# Patient Record
Sex: Female | Born: 1985 | Race: Black or African American | Hispanic: No | Marital: Married | Smoking: Never smoker
Health system: Southern US, Community
[De-identification: ages and names within clinical notes are randomized; demographics above are authoritative.]

## PROBLEM LIST (undated history)

## (undated) DIAGNOSIS — O139 Gestational [pregnancy-induced] hypertension without significant proteinuria, unspecified trimester: Secondary | ICD-10-CM

## (undated) DIAGNOSIS — F419 Anxiety disorder, unspecified: Secondary | ICD-10-CM

## (undated) HISTORY — PX: LASIK: SHX215

## (undated) HISTORY — PX: WISDOM TOOTH EXTRACTION: SHX21

---

## 2016-06-22 HISTORY — PX: CERVICAL POLYPECTOMY: SHX88

## 2019-10-01 ENCOUNTER — Emergency Department (HOSPITAL_COMMUNITY): Payer: No Typology Code available for payment source

## 2019-10-01 ENCOUNTER — Other Ambulatory Visit: Payer: Self-pay

## 2019-10-01 ENCOUNTER — Encounter (HOSPITAL_COMMUNITY): Payer: Self-pay | Admitting: Emergency Medicine

## 2019-10-01 ENCOUNTER — Emergency Department (HOSPITAL_COMMUNITY)
Admission: EM | Admit: 2019-10-01 | Discharge: 2019-10-02 | Disposition: A | Discharge status: Home / Self Care | Payer: No Typology Code available for payment source | Attending: Emergency Medicine | Admitting: Emergency Medicine

## 2019-10-01 DIAGNOSIS — Z3A01 Less than 8 weeks gestation of pregnancy: Secondary | ICD-10-CM | POA: Insufficient documentation

## 2019-10-01 DIAGNOSIS — O3680X0 Pregnancy with inconclusive fetal viability, not applicable or unspecified: Secondary | ICD-10-CM

## 2019-10-01 DIAGNOSIS — N760 Acute vaginitis: Secondary | ICD-10-CM | POA: Insufficient documentation

## 2019-10-01 DIAGNOSIS — N939 Abnormal uterine and vaginal bleeding, unspecified: Secondary | ICD-10-CM

## 2019-10-01 DIAGNOSIS — R0602 Shortness of breath: Secondary | ICD-10-CM | POA: Insufficient documentation

## 2019-10-01 DIAGNOSIS — R102 Pelvic and perineal pain: Secondary | ICD-10-CM | POA: Insufficient documentation

## 2019-10-01 DIAGNOSIS — O209 Hemorrhage in early pregnancy, unspecified: Secondary | ICD-10-CM | POA: Diagnosis present

## 2019-10-01 DIAGNOSIS — O200 Threatened abortion: Secondary | ICD-10-CM | POA: Diagnosis not present

## 2019-10-01 DIAGNOSIS — B9689 Other specified bacterial agents as the cause of diseases classified elsewhere: Secondary | ICD-10-CM

## 2019-10-01 LAB — BASIC METABOLIC PANEL
Anion gap: 11 (ref 5–15)
BUN: 7 mg/dL (ref 6–20)
CO2: 22 mmol/L (ref 22–32)
Calcium: 9.2 mg/dL (ref 8.9–10.3)
Chloride: 103 mmol/L (ref 98–111)
Creatinine, Ser: 0.88 mg/dL (ref 0.44–1.00)
GFR calc Af Amer: >60 mL/min (ref >60)
GFR calc non Af Amer: >60 mL/min (ref >60)
Glucose, Bld: 150 mg/dL — ABNORMAL HIGH (ref 70–99)
Potassium: 3 mmol/L — ABNORMAL LOW (ref 3.5–5.1)
Sodium: 136 mmol/L (ref 135–145)

## 2019-10-01 LAB — CBC
HCT: 38.5 % (ref 36.0–46.0)
Hemoglobin: 12.6 g/dL (ref 12.0–15.0)
MCH: 25 pg — ABNORMAL LOW (ref 26.0–34.0)
MCHC: 32.7 g/dL (ref 30.0–36.0)
MCV: 76.4 fL — ABNORMAL LOW (ref 80.0–100.0)
Platelets: 260 10*3/uL (ref 150–400)
RBC: 5.04 MIL/uL (ref 3.87–5.11)
RDW: 12.3 % (ref 11.5–15.5)
WBC: 8.2 10*3/uL (ref 4.0–10.5)
nRBC: 0 % (ref 0.0–0.2)

## 2019-10-01 LAB — I-STAT BETA HCG BLOOD, ED (MC, WL, AP ONLY): I-stat hCG, quantitative: 1899.7 m[IU]/mL — ABNORMAL HIGH (ref <5)

## 2019-10-01 NOTE — ED Triage Notes (Signed)
Pt reports vaginal bleeding that started yesterday with spotting. Pt reports this afternoon she was passing golf ball sized clots. Pt reports left pelvic pain. Pt reports she has been sob since last week and thought it was her allergies but woke up this morning feeling worse. Pt reports 7lb weight gain this past week. LMP ended 09/24/19.

## 2019-10-02 ENCOUNTER — Emergency Department (HOSPITAL_COMMUNITY): Payer: No Typology Code available for payment source

## 2019-10-02 ENCOUNTER — Encounter (HOSPITAL_COMMUNITY): Payer: Self-pay | Admitting: Emergency Medicine

## 2019-10-02 LAB — URINALYSIS, ROUTINE W REFLEX MICROSCOPIC
Bacteria, UA: NONE SEEN
Bilirubin Urine: NEGATIVE
Glucose, UA: NEGATIVE mg/dL
Ketones, ur: NEGATIVE mg/dL
Leukocytes,Ua: NEGATIVE
Nitrite: NEGATIVE
Protein, ur: NEGATIVE mg/dL
Specific Gravity, Urine: 1.017 (ref 1.005–1.030)
pH: 6 (ref 5.0–8.0)

## 2019-10-02 LAB — GC/CHLAMYDIA PROBE AMP (~~LOC~~) NOT AT ARMC
Chlamydia: NEGATIVE
Comment: NEGATIVE
Comment: NORMAL
Neisseria Gonorrhea: NEGATIVE

## 2019-10-02 LAB — WET PREP, GENITAL
Sperm: NONE SEEN
Trich, Wet Prep: NONE SEEN
Yeast Wet Prep HPF POC: NONE SEEN

## 2019-10-02 LAB — TROPONIN I (HIGH SENSITIVITY): Troponin I (High Sensitivity): 4 ng/L (ref <18)

## 2019-10-02 MED ORDER — METRONIDAZOLE 500 MG PO TABS
500.0000 mg | ORAL_TABLET | ORAL | Status: AC
  Administered 2019-10-02: 500 mg via ORAL
  Filled 2019-10-02: qty 1

## 2019-10-02 MED ORDER — ALBUTEROL SULFATE HFA 108 (90 BASE) MCG/ACT IN AERS
2.0000 | INHALATION_SPRAY | Freq: Once | RESPIRATORY_TRACT | Status: AC
  Administered 2019-10-02: 2 via RESPIRATORY_TRACT
  Filled 2019-10-02: qty 6.7

## 2019-10-02 MED ORDER — METRONIDAZOLE 500 MG PO TABS: 500.0000 mg | ORAL_TABLET | Freq: Two times a day (BID) | ORAL | 0 refills | Status: DC

## 2019-10-02 NOTE — ED Notes (Signed)
Wet prep recollected and sent at this time

## 2019-10-02 NOTE — ED Provider Notes (Addendum)
[facility] EMERGENCY DEPARTMENT Provider Note   CSN: [id number] Arrival date & time: 10/01/19  1931     History Chief Complaint  Patient presents with  . Shortness of Breath  . Vaginal Bleeding    Mary Duran is a 34 y.o. female.  The history is provided by the patient.  Shortness of Breath Severity:  Moderate Onset quality:  Gradual Timing:  Constant Progression:  Unchanged Chronicity:  New Context: known allergens and pollens   Relieved by:  Nothing Worsened by:  Nothing Ineffective treatments: claritin and singulair. Associated symptoms: no chest pain, no claudication, no cough, no diaphoresis, no ear pain, no fever, no neck pain, no PND, no sore throat, no sputum production, no syncope, no swollen glands and no vomiting   Associated symptoms comment:  No leg pain no swelling Risk factors: no hx of PE/DVT   Vaginal Bleeding Quality:  Clots Severity:  Moderate Onset quality:  Gradual Duration:  1 day Timing:  Constant Progression:  Unchanged Chronicity:  New Menstrual history:  Irregular Possible pregnancy: yes   Context: not after intercourse   Relieved by:  Nothing Worsened by:  Nothing Ineffective treatments:  None tried Associated symptoms: no dizziness and no fever   Associated symptoms comment:  Pelvic pain on the left  Patient who is A positive presents with left pelvic pain and had a normal period for 4 days that ended on 4/4 and now has left pelvic pain and bleeding with passage of clots.  No CP.  No cough. No hemoptysis. No f/c/r.  No trauma.  No leg pain nor swelling.  Has seasonal allergies but medications do not seem to be controlling her symptoms.      History reviewed. No pertinent past medical history.  There are no problems to display for this patient.   Past Surgical History:  Procedure Laterality Date  . CERVICAL POLYPECTOMY  2018  . LASIK    . WISDOM TOOTH EXTRACTION       OB History   No obstetric history  on file.     History reviewed. No pertinent family history.  Social History   Tobacco Use  . Smoking status: Never Smoker  . Smokeless tobacco: Never Used  Substance Use Topics  . Alcohol use: Never  . Drug use: Never    Home Medications Prior to Admission medications   Not on File    Allergies    Sulfa antibiotics  Review of Systems   Review of Systems  Constitutional: Negative for diaphoresis and fever.  HENT: Negative for ear pain and sore throat.   Eyes: Negative for visual disturbance.  Respiratory: Positive for shortness of breath. Negative for cough and sputum production.   Cardiovascular: Negative for chest pain, claudication, syncope and PND.  Gastrointestinal: Negative for vomiting.  Genitourinary: Positive for pelvic pain and vaginal bleeding.  Musculoskeletal: Negative for neck pain.  Neurological: Negative for dizziness.  Psychiatric/Behavioral: Negative for agitation.    Physical Exam Updated Vital Signs BP 118/81   Pulse 69   Temp 98.3 F (36.8 C) (Oral)   Resp 17   Ht 5\' 3"  (1.6 m)   Wt 80.3 kg   LMP 09/24/2019   SpO2 100%   BMI 31.35 kg/m   Physical Exam  ED Results / Procedures / Treatments   Labs (all labs ordered are listed, but only abnormal results are displayed) Results for orders placed or performed during the hospital encounter of 10/01/19  Wet prep, genital  Specimen: Vaginal  Result Value Ref Range   Yeast Wet Prep HPF POC NONE SEEN NONE SEEN   Trich, Wet Prep NONE SEEN NONE SEEN   Clue Cells Wet Prep HPF POC PRESENT (A) NONE SEEN   WBC, Wet Prep HPF POC MODERATE (A) NONE SEEN   Sperm NONE SEEN   CBC  Result Value Ref Range   WBC 8.2 4.0 - 10.5 K/uL   RBC 5.04 3.87 - 5.11 MIL/uL   Hemoglobin 12.6 12.0 - 15.0 g/dL   HCT 38.5 36.0 - 46.0 %   MCV 76.4 (L) 80.0 - 100.0 fL   MCH 25.0 (L) 26.0 - 34.0 pg   MCHC 32.7 30.0 - 36.0 g/dL   RDW 12.3 11.5 - 15.5 %   Platelets 260 150 - 400 K/uL   nRBC 0.0 0.0 - 0.2 %  Basic  metabolic panel  Result Value Ref Range   Sodium 136 135 - 145 mmol/L   Potassium 3.0 (L) 3.5 - 5.1 mmol/L   Chloride 103 98 - 111 mmol/L   CO2 22 22 - 32 mmol/L   Glucose, Bld 150 (H) 70 - 99 mg/dL   BUN 7 6 - 20 mg/dL   Creatinine, Ser 0.88 0.44 - 1.00 mg/dL   Calcium 9.2 8.9 - 10.3 mg/dL   GFR calc non Af Amer >60 >60 mL/min   GFR calc Af Amer >60 >60 mL/min   Anion gap 11 5 - 15  I-Stat beta hCG blood, ED  Result Value Ref Range   I-stat hCG, quantitative 1,899.7 (H) <5 mIU/mL   Comment 3           DG Chest 2 View  Result Date: 10/01/2019 CLINICAL DATA:  35 year old female with shortness of breath. EXAM: CHEST - 2 VIEW COMPARISON:  None. FINDINGS: The heart size and mediastinal contours are within normal limits. Both lungs are clear. The visualized skeletal structures are unremarkable. IMPRESSION: No active cardiopulmonary disease. Electronically Signed   By: Anner Crete M.D.   On: 10/01/2019 22:43    EKG EKG Interpretation  Date/Time:  Sunday Mary Duran 11 2021 20:00:31 EDT Ventricular Rate:  65 PR Interval:  148 QRS Duration: 80 QT Interval:  400 QTC Calculation: 416 R Axis:   72 Text Interpretation: Normal sinus rhythm Confirmed by Mary Duran, [place]au ([id number]) on 10/01/2019 11:51:26 PM   Radiology DG Chest 2 View  Result Date: 10/01/2019 CLINICAL DATA:  34 year old female with shortness of breath. EXAM: CHEST - 2 VIEW COMPARISON:  None. FINDINGS: The heart size and mediastinal contours are within normal limits. Both lungs are clear. The visualized skeletal structures are unremarkable. IMPRESSION: No active cardiopulmonary disease. Electronically Signed   By: Anner Crete M.D.   On: 10/01/2019 22:43    Procedures Procedures (including critical care time)  Medications Ordered in ED Medications  albuterol (VENTOLIN HFA) 108 (90 Base) MCG/ACT inhaler 2 puff (has no administration in time range)    ED Course  I have reviewed the triage vital signs and the nursing  notes.  Pertinent labs & imaging results that were available during my care of the patient were reviewed by me and considered in my medical decision making (see chart for details).    305 case d/w Dr. Lenoria Chime, nothing to do about the sugar at this time, OB will follow up on this.  Patient to be called by OB with office follow up in 2 days.  Ectopic and threatened ab d/c instructions.    Ruled out for MI  in the ED with a normal troponin and normal EKG, heart score is 1.    Wells score 0, I do not believe this is a PE.  I believe this is likely seasonal allergies. She is covid vaccinated with [employer].  The patient did not desaturate with ambulation nor did she become tachycardiac.  She is not anemic.  She appears comfortable.    Patient has been given strict threatened miscarriage and ectopic precautions.  Patient has been given pelvic rest instructions.  Patient verbalizes understanding of all instructions and agrees to follow up.  Patient is instructed to return for worsening shortness of breath, cough, fever chest pain or hemoptysis.  Patient verbalizes understanding and agrees to follow up.   Final Clinical Impression(s) / ED Diagnoses Final diagnoses:  Vaginal bleeding   Return for weakness, numbness, changes in vision or speech, fevers >100.4 unrelieved by medication, shortness of breath, intractable vomiting, or diarrhea, abdominal pain, Inability to tolerate liquids or food, cough, altered mental status or any concerns. No signs of systemic illness or infection. The patient is nontoxic-appearing on exam and vital signs are within normal limits.   I have reviewed the triage vital signs and the nursing notes. Pertinent labs &imaging results that were available during my care of the patient were reviewed by me and considered in my medical decision making (see chart for details).  After history, exam, and medical workup I feel the patient has been appropriately medically screened and is  safe for discharge home. Pertinent diagnoses were discussed with the patient. Patient was givenstrictreturn precautions.    Miriah Maruyama, MD 10/02/19 Loyal Gambler, Elea Holtzclaw, MD 10/02/19 1610

## 2019-10-02 NOTE — ED Notes (Signed)
Pt to US.

## 2019-10-02 NOTE — ED Notes (Signed)
Wet prep needs to be recollect.

## 2019-10-02 NOTE — ED Notes (Signed)
Discharge instructions including DX bacterial vaginosis and miscarge discussed with pt and significant other via phone call. Pt [facility]. Pt verbalized understanding with no questions at this time.

## 2019-10-02 NOTE — ED Notes (Signed)
Pt ambulated from room 21 around nurses station and back to room. Pt denies any sob or pain. Pt O2 stats stay around 97%. Pt RR 14

## 2019-10-04 ENCOUNTER — Other Ambulatory Visit: Payer: Self-pay

## 2019-10-04 ENCOUNTER — Encounter: Payer: Self-pay | Admitting: General Practice

## 2019-10-04 ENCOUNTER — Ambulatory Visit (INDEPENDENT_AMBULATORY_CARE_PROVIDER_SITE_OTHER): Payer: Non-veteran care | Admitting: General Practice

## 2019-10-04 DIAGNOSIS — O3680X0 Pregnancy with inconclusive fetal viability, not applicable or unspecified: Secondary | ICD-10-CM

## 2019-10-04 LAB — BETA HCG QUANT (REF LAB): hCG Quant: 1738 m[IU]/mL

## 2019-10-04 NOTE — Progress Notes (Signed)
Patient presents to office today for stat bhcg following up from recent ER visit. Patient reports she came into the ER for spotting and passage of a couple clots. States bleeding stopped on Monday and she denies pain. Discussed with patient we are monitoring her bhcg levels today. Discussed process and potential follow up recommendations. Discussed results/history will be reviewed with a doctor in the office and we will contact her in a couple hours with results/updated plan of care. Patient verbalized understanding and provided callback number [phone].   Reviewed results/history with Dr Shawnie Pons who states the POCT hcg patient had on 4/11 cannot be used for comparison, patient will need repeat bhcg in 48 hours.   Called patient and discussed results with her. Patient verbalized understanding & will be here Friday at 8:30 for repeat stat bhcg. Patient asked for letter to be excused from physical training this weekend. Told patient letter will be placed in chart. Patient will return Friday for stat bhcg. Ectopic precautions reviewed.   Chase Caller RN BSN 10/04/19

## 2019-10-05 NOTE — Progress Notes (Signed)
Patient seen and assessed by nursing staff.  Agree with documentation and plan.  

## 2019-10-06 ENCOUNTER — Ambulatory Visit (INDEPENDENT_AMBULATORY_CARE_PROVIDER_SITE_OTHER): Payer: Non-veteran care

## 2019-10-06 ENCOUNTER — Other Ambulatory Visit: Payer: Self-pay

## 2019-10-06 DIAGNOSIS — O3680X0 Pregnancy with inconclusive fetal viability, not applicable or unspecified: Secondary | ICD-10-CM

## 2019-10-06 LAB — BETA HCG QUANT (REF LAB): hCG Quant: 1293 m[IU]/mL

## 2019-10-06 NOTE — Progress Notes (Signed)
Pt here today for STAT Beta Lab for pregnancy of unknown location.  Pt had a visit on 10/04/19 which a baseline beta was drawn.  Per recommendation of Dr. Shawnie Pons pt needed to come in 48hrs for stat beta to be able to compare results due to the POCT that was drawn in the ED on 10/02/19 could not be used as a comparison.  Pt denies any vaginal bleeding or pain.  Pt states that she will need to drive three hours to [place] and if that is okay if the results are rising.  I advised pt that it should be okay however to keep in mind that if something was to happen that she would need to go to the ER there in [place].  Pt stated okay.  I explained to the pt that the results will come back in about two hours in which I will call with results and f/u.  Pt verbalized understanding.   Notified Vonzella Nipple, PA pt's beta results of 1293. Providers recommendation is that pt's levels have dropped however have not dropped enough to rule out ectopic vs miscarriage.  Provider request for pt to come in for another STAT beta on Monday.  Notified pt provider's recommendation.  Pt states that she will be able to come on 10/09/19 @ 0820 for STAT Beta.  Front office notified.   Addison Naegeli, RN  10/06/18

## 2019-10-06 NOTE — Progress Notes (Signed)
Chart reviewed for nurse visit. Agree with plan of care.   Marny Lowenstein, PA-C 10/06/2019 1:44 PM

## 2019-10-09 ENCOUNTER — Encounter: Payer: Self-pay | Admitting: General Practice

## 2019-10-09 ENCOUNTER — Other Ambulatory Visit: Payer: Self-pay

## 2019-10-09 ENCOUNTER — Ambulatory Visit (INDEPENDENT_AMBULATORY_CARE_PROVIDER_SITE_OTHER): Payer: Non-veteran care | Admitting: General Practice

## 2019-10-09 DIAGNOSIS — O3680X0 Pregnancy with inconclusive fetal viability, not applicable or unspecified: Secondary | ICD-10-CM

## 2019-10-09 LAB — BETA HCG QUANT (REF LAB): hCG Quant: 1944 m[IU]/mL

## 2019-10-09 NOTE — Progress Notes (Signed)
Chart reviewed - agree with CMA/RN documentation.  ° °

## 2019-10-09 NOTE — Progress Notes (Signed)
Patient presents to office today for stat bhcg following up bhcg on Friday- results were not conclusive to rule out ectopic vs SAB. Patient denies pain or bleeding today. Discussed with patient we are monitoring her bhcg levels today. Discussed results/history will be reviewed with a doctor in the office and we will contact her in a couple hours with results/updated plan of care. Patient verbalized understanding and provided callback number [phone]. Letter provided to patient per request to be excused from training this past weekend.  Reviewed results with Dr Adrian Blackwater who finds abnormal rise in bhcg levels. Results are still not conclusive to rule out ectopic versus eventual SAB. Patient should have follow up bhcg in 48 hours.   Called patient and reviewed results with her. Discussed follow up recommendation. Patient will return Wednesday 4/21 @ 850am for stat bhcg. Ectopic precautions reviewed. Patient verbalized understanding.  Chase Caller RN BSN 10/09/19

## 2019-10-11 ENCOUNTER — Other Ambulatory Visit: Payer: Self-pay

## 2019-10-11 ENCOUNTER — Inpatient Hospital Stay (HOSPITAL_COMMUNITY)
Admission: AD | Admit: 2019-10-11 | Discharge: 2019-10-11 | Disposition: A | Discharge status: Home / Self Care | Payer: No Typology Code available for payment source | Attending: Family Medicine | Admitting: Family Medicine

## 2019-10-11 ENCOUNTER — Ambulatory Visit (INDEPENDENT_AMBULATORY_CARE_PROVIDER_SITE_OTHER): Payer: Non-veteran care

## 2019-10-11 ENCOUNTER — Encounter (HOSPITAL_COMMUNITY): Payer: Self-pay | Admitting: Family Medicine

## 2019-10-11 DIAGNOSIS — O3680X0 Pregnancy with inconclusive fetal viability, not applicable or unspecified: Secondary | ICD-10-CM

## 2019-10-11 DIAGNOSIS — O009 Unspecified ectopic pregnancy without intrauterine pregnancy: Secondary | ICD-10-CM

## 2019-10-11 DIAGNOSIS — Z3A01 Less than 8 weeks gestation of pregnancy: Secondary | ICD-10-CM

## 2019-10-11 DIAGNOSIS — Z882 Allergy status to sulfonamides status: Secondary | ICD-10-CM | POA: Diagnosis not present

## 2019-10-11 HISTORY — DX: Anxiety disorder, unspecified: F41.9

## 2019-10-11 LAB — COMPREHENSIVE METABOLIC PANEL
ALT: 36 U/L (ref 0–44)
AST: 28 U/L (ref 15–41)
Albumin: 3.6 g/dL (ref 3.5–5.0)
Alkaline Phosphatase: 57 U/L (ref 38–126)
Anion gap: 7 (ref 5–15)
BUN: 6 mg/dL (ref 6–20)
CO2: 25 mmol/L (ref 22–32)
Calcium: 8.8 mg/dL — ABNORMAL LOW (ref 8.9–10.3)
Chloride: 105 mmol/L (ref 98–111)
Creatinine, Ser: 0.85 mg/dL (ref 0.44–1.00)
GFR calc Af Amer: >60 mL/min (ref >60)
GFR calc non Af Amer: >60 mL/min (ref >60)
Glucose, Bld: 131 mg/dL — ABNORMAL HIGH (ref 70–99)
Potassium: 4 mmol/L (ref 3.5–5.1)
Sodium: 137 mmol/L (ref 135–145)
Total Bilirubin: 0.3 mg/dL (ref 0.3–1.2)
Total Protein: 7.1 g/dL (ref 6.5–8.1)

## 2019-10-11 LAB — CBC
HCT: 38.5 % (ref 36.0–46.0)
Hemoglobin: 12.8 g/dL (ref 12.0–15.0)
MCH: 24.8 pg — ABNORMAL LOW (ref 26.0–34.0)
MCHC: 33.2 g/dL (ref 30.0–36.0)
MCV: 74.5 fL — ABNORMAL LOW (ref 80.0–100.0)
Platelets: 269 10*3/uL (ref 150–400)
RBC: 5.17 MIL/uL — ABNORMAL HIGH (ref 3.87–5.11)
RDW: 12.4 % (ref 11.5–15.5)
WBC: 7.4 10*3/uL (ref 4.0–10.5)
nRBC: 0 % (ref 0.0–0.2)

## 2019-10-11 LAB — ABO/RH: ABO/RH(D): A POS

## 2019-10-11 LAB — BETA HCG QUANT (REF LAB): hCG Quant: 2138 m[IU]/mL

## 2019-10-11 MED ORDER — METHOTREXATE FOR ECTOPIC PREGNANCY
50.0000 mg/m2 | Freq: Once | INTRAMUSCULAR | Status: AC
  Administered 2019-10-11: 95 mg via INTRAMUSCULAR
  Filled 2019-10-11: qty 1

## 2019-10-11 MED ORDER — PROMETHAZINE HCL 25 MG PO TABS: 25.0000 mg | ORAL_TABLET | Freq: Four times a day (QID) | ORAL | 0 refills | Status: DC | PRN

## 2019-10-11 NOTE — Progress Notes (Signed)
Pt here today for rpt STAT Beta Lab from visit on 10/09/19 that her levels did not decline appropriately.  Pt denies any vaginal bleeding or pain.  Pt advised that we will determine her f/u once we receive her results within a couple of hours.  I informed pt that I will call her with results and f/u.  Pt verbalized understanding with no further questions.   Received results of pt's beta 2138.  Notified Dr. Macon Large who states that pt has had 4 inappropriate beta levels that indicates ectopic.  Provider recommends that pt goes to MAU for MTX tx.  Notified pt provider's recommendation and the need for MTX tx.  I advised pt to go MAU to receive tx.  Pt stated that she would need to get her children together before she goes.  I informed pt that it would okay and that MAU would be expecting her arrival.   Pt verbalized understanding.   MAU charge, [place], notified.    Addison Naegeli, RN  10/11/19

## 2019-10-11 NOTE — MAU Provider Note (Signed)
Chief Complaint: possible ectopic preg   First Provider Initiated Contact with Patient 10/11/19 1524     SUBJECTIVE HPI: Mary Duran is a 34 y.o. G3P2002 at 24w3dwho presents to [facility]aternity Admissions for ectopic pregnancy. HCGs have been abnormal & patient was instructed to come here for methotrexate. Initially presented to the ED on 4/11 for vaginal bleeding. Has had hormone level checks since then that are not rising appropriately. Currently denies any abdominal pain or vaginal bleeding.   4/11=1899 40/17=[id number]/16=1293 40/30=09234/21=2138  Past Medical History:  Diagnosis Date  . Anxiety    OB History  Gravida Para Term Preterm AB Living  '3 2 2 ' 0 0 2  SAB TAB Ectopic Multiple Live Births          2    # Outcome Date GA Lbr Len/2nd Weight Sex Delivery Anes PTL Lv  3 Current           2 Term     F Vag-Spont  N LIV  1 Term     M Vag-Spont  N LIV    Obstetric Comments  [place]  Past Surgical History:  Procedure Laterality Date  . CERVICAL POLYPECTOMY  2018  . LASIK    . WISDOM TOOTH EXTRACTION     Social History   Socioeconomic History  . Marital status: Married    Spouse name: Not on file  . Number of children: Not on file  . Years of education: Not on file  . Highest education level: Not on file  Occupational History  . Not on file  Tobacco Use  . Smoking status: Never Smoker  . Smokeless tobacco: Never Used  Substance and Sexual Activity  . Alcohol use: Not Currently    Comment: rare  . Drug use: Never  . Sexual activity: Not Currently  Other Topics Concern  . Not on file  Social History Narrative  . Not on file   Social Determinants of Health   Financial Resource Strain:   . Difficulty of Paying Living Expenses:   Food Insecurity:   . Worried About RCharity fundraiserin the Last Year:   . RArboriculturistin the Last Year:   Transportation Needs:   . LFilm/video editor(Medical):   .Marland KitchenLack of Transportation  (Non-Medical):   Physical Activity:   . Days of Exercise per Week:   . Minutes of Exercise per Session:   Stress:   . Feeling of Stress :   Social Connections:   . Frequency of Communication with Friends and Family:   . Frequency of Social Gatherings with Friends and Family:   . Attends Religious Services:   . Active Member of Clubs or Organizations:   . Attends CArchivistMeetings:   .Marland KitchenMarital Status:   Intimate Partner Violence:   . Fear of Current or Ex-Partner:   . Emotionally Abused:   .Marland KitchenPhysically Abused:   . Sexually Abused:    Family History  Problem Relation Age of Onset  . Hypertension Mother   . Hypertension Father    No current facility-administered medications on file prior to encounter.   Current Outpatient Medications on File Prior to Encounter  Medication Sig Dispense Refill  . loratadine (CLARITIN) 10 MG tablet Take 10 mg by mouth daily.    . Prenatal Vit-Fe Fumarate-FA (MULTIVITAMIN-PRENATAL) 27-0.8 MG TABS tablet Take 1 tablet by mouth daily at 12 noon.    .Marland Kitchen  montelukast (SINGULAIR) 10 MG tablet montelukast 10 mg tablet  Take 1 tablet every day by oral route.    Marland Kitchen PROAIR HFA 108 (90 Base) MCG/ACT inhaler      Allergies  Allergen Reactions  . Latex Rash  . Sulfa Antibiotics Rash    I have reviewed patient's Past Medical Hx, Surgical Hx, Family Hx, Social Hx, medications and allergies.   Review of Systems  Constitutional: Negative.   Gastrointestinal: Negative.   Genitourinary: Negative.     OBJECTIVE Patient Vitals for the past 24 hrs:  BP Temp Temp src Pulse Resp SpO2 Height Weight  10/11/19 1528 125/80 99.1 F (37.3 C) Oral 78 16 100 % '5\' 3"'  (1.6 m) 82.3 kg   Constitutional: Well-developed, well-nourished female in no acute distress.  Cardiovascular: normal rate & rhythm, no murmur Respiratory: normal rate and effort. Lung sounds clear throughout GI: Abd soft, non-tender, Pos BS x 4. No guarding or rebound tenderness MS:  Extremities nontender, no edema, normal ROM Neurologic: Alert and oriented x 4.     LAB RESULTS Results for orders placed or performed during the hospital encounter of 10/11/19 (from the past 24 hour(s))  ABO/Rh     Status: None   Collection Time: 10/11/19  2:46 PM  Result Value Ref Range   ABO/RH(D) A POS    No rh immune globuloin      NOT A RH IMMUNE GLOBULIN CANDIDATE, PT RH POSITIVE Performed at [facility] [address] [place], [place] [address]   CBC     Status: Abnormal   Collection Time: 10/11/19  2:46 PM  Result Value Ref Range   WBC 7.4 4.0 - 10.5 K/uL   RBC 5.17 (H) 3.87 - 5.11 MIL/uL   Hemoglobin 12.8 12.0 - 15.0 g/dL   HCT 38.5 36.0 - 46.0 %   MCV 74.5 (L) 80.0 - 100.0 fL   MCH 24.8 (L) 26.0 - 34.0 pg   MCHC 33.2 30.0 - 36.0 g/dL   RDW 12.4 11.5 - 15.5 %   Platelets 269 150 - 400 K/uL   nRBC 0.0 0.0 - 0.2 %  Comprehensive metabolic panel     Status: Abnormal   Collection Time: 10/11/19  2:46 PM  Result Value Ref Range   Sodium 137 135 - 145 mmol/L   Potassium 4.0 3.5 - 5.1 mmol/L   Chloride 105 98 - 111 mmol/L   CO2 25 22 - 32 mmol/L   Glucose, Bld 131 (H) 70 - 99 mg/dL   BUN 6 6 - 20 mg/dL   Creatinine, Ser 0.85 0.44 - 1.00 mg/dL   Calcium 8.8 (L) 8.9 - 10.3 mg/dL   Total Protein 7.1 6.5 - 8.1 g/dL   Albumin 3.6 3.5 - 5.0 g/dL   AST 28 15 - 41 U/L   ALT 36 0 - 44 U/L   Alkaline Phosphatase 57 38 - 126 U/L   Total Bilirubin 0.3 0.3 - 1.2 mg/dL   GFR calc non Af Amer >60 >60 mL/min   GFR calc Af Amer >60 >60 mL/min   Anion gap 7 5 - 15    IMAGING No results found.  MAU COURSE Orders Placed This Encounter  Procedures  . CBC  . Comprehensive metabolic panel  . ABO/Rh  . Discharge patient   Meds ordered this encounter  Medications  . methotrexate [facility]) chemo injection kit 95 mg  . promethazine (PHENERGAN) 25 MG tablet    Sig: Take 1 tablet (25 mg total) by  mouth every 6 (six) hours as needed for nausea or vomiting.    Dispense:   20 tablet    Refill:  0    Order Specific Question:   Supervising Provider    Answer:   Merrily Pew    MDM RH positive  Reviewed labs with Dr. Kennon Rounds. Will offer methotrexate.   Vital signs stable & mtx labs reviewed.   Discussed results & treatment with patient.   The risks of methotrexate were reviewed including failure requiring repeat dosing or eventual surgery. She understands that methotrexate involves frequent return visits to monitor lab values and that she remains at risk of ectopic rupture until her beta is less than assay. ?The patient opts to proceed with methotrexate.  She has no history of hepatic or renal dysfunction, has normal BUN/Cr/LFT's/platelets.  She is felt to be reliable for follow-up. Side effects of photosensitivity & GI upset were discussed.  She knows to avoid direct sunlight and abstain from alcohol, aspirin and aspirin-like products for two weeks. She was counseled to discontinue any MVI with folic acid. ?She understands to follow up on D4 (Saturday) and D7 (Tuesday) for repeat BHCG and was given the instruction sheet. Strict ectopic precautions were reviewed, the patient knows to call with any abdominal pain, vomiting, fainting, or any concerns with her health.  Rh+, no Rhogam necessary.   ASSESSMENT 1. Ectopic pregnancy without intrauterine pregnancy, unspecified location     PLAN Discharge home in stable condition. Strict ectopic return precautions Return to MAU Saturday for day 4 HCG    Follow-up Information    [facility]. Go on 10/14/2019.   Specialty: Obstetrics and Gynecology Why: return Saturday for blood work or sooner for worsening symptoms Contact information: [address] [id number] [place] [phone]         Allergies as of 10/11/2019      Reactions   Latex Rash   Sulfa Antibiotics Rash      Medication List    STOP taking these medications    multivitamin-prenatal 27-0.8 MG Tabs tablet     TAKE these medications   loratadine 10 MG tablet Commonly known as: CLARITIN Take 10 mg by mouth daily.   montelukast 10 MG tablet Commonly known as: SINGULAIR montelukast 10 mg tablet  Take 1 tablet every day by oral route.   ProAir HFA 108 (90 Base) MCG/ACT inhaler Generic drug: albuterol   promethazine 25 MG tablet Commonly known as: PHENERGAN Take 1 tablet (25 mg total) by mouth every 6 (six) hours as needed for nausea or vomiting.        Jorje Guild, NP 10/11/2019  4:22 PM

## 2019-10-11 NOTE — Discharge Instructions (Signed)
Methotrexate Treatment for an Ectopic Pregnancy, Care After This sheet gives you information about how to care for yourself after your procedure. Your health care provider may also give you more specific instructions. If you have problems or questions, contact your health care provider. What can I expect after the procedure? After the procedure, it is common to have:  Abdominal cramping.  Vaginal bleeding.  Fatigue.  Nausea.  Vomiting.  Diarrhea. Blood tests will be taken at timed intervals for several days or weeks to check your pregnancy hormone levels. The blood tests will be done until the pregnancy hormone can no longer be detected in the blood. Follow these instructions at home: Activity  Do not have sex until your health care provider approves.  Limit activities that take a lot of effort as told by your health care provider. Medicines  Take over the counter and prescription medicines only as told by your health care provider.  Do not take aspirin, ibuprofen, naproxen, or any other NSAIDs.  Do not take folic acid, prenatal vitamins, or other vitamins that contain folic acid. General instructions   Do not drink alcohol.  Follow instructions from your health care provider on how and when to report any symptoms that may indicate a ruptured ectopic pregnancy.  Keep all follow-up visits as told by your health care provider. This is important. Contact a health care provider if:  You have persistent nausea and vomiting.  You have persistent diarrhea.  You are having a reaction to the medicine, such as: ? Tiredness. ? Skin rash. ? Hair loss. Get help right away if:  Your abdominal or pelvic pain gets worse.  You have more vaginal bleeding.  You feel light-headed or you faint.  You have shortness of breath.  Your heart rate increases.  You develop a cough.  You have chills.  You have a fever. Summary  After the procedure, it is common to have symptoms  of abdominal cramping, vaginal bleeding and fatigue. You may also experience other symptoms.  Blood tests will be taken at timed intervals for several days or weeks to check your pregnancy hormone levels. The blood tests will be done until the pregnancy hormone can no longer be detected in the blood.  Limit strenuous activity as told by your health care provider.  Follow instructions from your health care provider on how and when to report any symptoms that may indicate a ruptured ectopic pregnancy. This information is not intended to replace advice given to you by your health care provider. Make sure you discuss any questions you have with your health care provider. Document Revised: 05/21/2017 Document Reviewed: 07/28/2016 Elsevier Patient Education  2020 Else[employer] Inc.       Ectopic Pregnancy  An ectopic pregnancy is when the fertilized egg attaches (implants) outside the uterus. Most ectopic pregnancies occur in one of the tubes where eggs travel from the ovary to the uterus (fallopian tubes), but the implanting can occur in other locations. In rare cases, ectopic pregnancies occur on the ovary, intestine, pelvis, abdomen, or cervix. In an ectopic pregnancy, the fertilized egg does not have the ability to develop into a normal, healthy baby. A ruptured ectopic pregnancy is one in which tearing or bursting of a fallopian tube causes internal bleeding. Often, there is intense lower abdominal pain, and vaginal bleeding sometimes occurs. Having an ectopic pregnancy can be life-threatening. If this dangerous condition is not treated, it can lead to blood loss, shock, or even death. What are the causes?   The most common cause of this condition is damage to one of the fallopian tubes. A fallopian tube may be narrowed or blocked, and that keeps the fertilized egg from reaching the uterus. What increases the risk? This condition is more likely to develop in women of childbearing age who have  different levels of risk. The levels of risk can be divided into three categories. High risk  You have gone through infertility treatment.  You have had an ectopic pregnancy before.  You have had surgery on the fallopian tubes, or another surgical procedure, such as an abortion.  You have had surgery to have the fallopian tubes tied (tubal ligation).  You have problems or diseases of the fallopian tubes.  You have been exposed to diethylstilbestrol (DES). This medicine was used until 1971, and it had effects on babies whose mothers took the medicine.  You become pregnant while using an IUD (intrauterine device) for birth control. Moderate risk  You have a history of infertility.  You have had an STI (sexually transmitted infection).  You have a history of pelvic inflammatory disease (PID).  You have scarring from endometriosis.  You have multiple sexual partners.  You smoke. Low risk  You have had pelvic surgery.  You use vaginal douches.  You became sexually active before age 18. What are the signs or symptoms? Common symptoms of this condition include normal pregnancy symptoms, such as missing a period, nausea, tiredness, abdominal pain, breast tenderness, and bleeding. However, ectopic pregnancy will have additional symptoms, such as:  Pain with intercourse.  Irregular vaginal bleeding or spotting.  Cramping or pain on one side or in the lower abdomen.  Fast heartbeat, low blood pressure, and sweating.  Passing out while having a bowel movement. Symptoms of a ruptured ectopic pregnancy and internal bleeding may include:  Sudden, severe pain in the abdomen and pelvis.  Dizziness, weakness, light-headedness, or fainting.  Pain in the shoulder or neck area. How is this diagnosed? This condition is diagnosed by:  A pelvic exam to locate pain or a mass in the abdomen.  A pregnancy test. This blood test checks for the presence as well as the specific level of  pregnancy hormone in the bloodstream.  Ultrasound. This is performed if a pregnancy test is positive. In this test, a probe is inserted into the vagina. The probe will detect a fetus, possibly in a location other than the uterus.  Taking a sample of uterus tissue (dilation and curettage, or D&C).  Surgery to perform a visual exam of the inside of the abdomen using a thin, lighted tube that has a tiny camera on the end (laparoscope).  Culdocentesis. This procedure involves inserting a needle at the top of the vagina, behind the uterus. If blood is present in this area, it may indicate that a fallopian tube is torn. How is this treated? This condition is treated with medicine or surgery. Medicine  An injection of a medicine (methotrexate) may be given to cause the pregnancy tissue to be absorbed. This medicine may save your fallopian tube. It may be given if: ? The diagnosis is made early, with no signs of active bleeding. ? The fallopian tube has not ruptured. ? You are considered to be a good candidate for the medicine. Usually, pregnancy hormone blood levels are checked after methotrexate treatment. This is to be sure that the medicine is effective. It may take 4-6 weeks for the pregnancy to be absorbed. Most pregnancies will be absorbed by 3 weeks.   Surgery  A laparoscope may be used to remove the pregnancy tissue.  If severe internal bleeding occurs, a larger cut (incision) may be made in the lower abdomen (laparotomy) to remove the fetus and placenta. This is done to stop the bleeding.  Part or all of the fallopian tube may be removed (salpingectomy) along with the fetus and placenta. The fallopian tube may also be repaired during the surgery.  In very rare circumstances, removal of the uterus (hysterectomy) may be required.  After surgery, pregnancy hormone testing may be done to be sure that there is no pregnancy tissue left. Whether your treatment is medicine or surgery, you may  receive a Rho (D) immune globulin shot to prevent problems with any future pregnancy. This shot may be given if:  You are Rh-negative and the baby's father is Rh-positive.  You are Rh-negative and you do not know the Rh type of the baby's father. Follow these instructions at home:  Rest and limit your activity after the procedure for as long as told by your health care provider.  Until your health care provider says that it is safe: ? Do not lift anything that is heavier than 10 lb (4.5 kg), or the limit that your health care provider tells you. ? Avoid physical exercise and any movement that requires effort (is strenuous).  To help prevent constipation: ? Eat a healthy diet that includes fruits, vegetables, and whole grains. ? Drink 6-8 glasses of water per day. Get help right away if:  You develop worsening pain that is not relieved by medicine.  You have: ? A fever or chills. ? Vaginal bleeding. ? Redness and swelling at the incision site. ? Nausea and vomiting.  You feel dizzy or weak.  You feel light-headed or you faint. This information is not intended to replace advice given to you by your health care provider. Make sure you discuss any questions you have with your health care provider. Document Revised: 05/21/2017 Document Reviewed: 01/08/2016 Elsevier Patient Education  2020 Elsevier Inc.  

## 2019-10-11 NOTE — MAU Note (Signed)
Sent over for further eval, hormone levels fluctuating. Physically doing ok, feels pregnant, feels tired. Denies pain or bleeding.

## 2019-10-12 NOTE — Progress Notes (Signed)
Patient seen and assessed by nursing staff during this encounter. I have reviewed the chart and agree with the documentation and plan.  Jaynie Collins, MD 10/12/2019 8:16 AM

## 2019-10-14 ENCOUNTER — Other Ambulatory Visit: Payer: Self-pay | Admitting: Certified Nurse Midwife

## 2019-10-14 ENCOUNTER — Inpatient Hospital Stay (HOSPITAL_COMMUNITY)
Admission: AD | Admit: 2019-10-14 | Discharge: 2019-10-14 | Disposition: A | Discharge status: Home / Self Care | Payer: No Typology Code available for payment source | Attending: Obstetrics and Gynecology | Admitting: Obstetrics and Gynecology

## 2019-10-14 ENCOUNTER — Inpatient Hospital Stay (HOSPITAL_COMMUNITY)
Admission: AD | Admit: 2019-10-14 | Discharge: 2019-10-14 | Disposition: A | Discharge status: Home / Self Care | Payer: No Typology Code available for payment source | Source: Home / Self Care | Attending: Obstetrics and Gynecology | Admitting: Obstetrics and Gynecology

## 2019-10-14 ENCOUNTER — Other Ambulatory Visit: Payer: Self-pay

## 2019-10-14 DIAGNOSIS — O3680X0 Pregnancy with inconclusive fetal viability, not applicable or unspecified: Secondary | ICD-10-CM

## 2019-10-14 DIAGNOSIS — O009 Unspecified ectopic pregnancy without intrauterine pregnancy: Secondary | ICD-10-CM

## 2019-10-14 DIAGNOSIS — O00109 Unspecified tubal pregnancy without intrauterine pregnancy: Secondary | ICD-10-CM | POA: Diagnosis not present

## 2019-10-14 LAB — HCG, QUANTITATIVE, PREGNANCY: hCG, Beta Chain, Quant, S: 3276 m[IU]/mL — ABNORMAL HIGH (ref <5)

## 2019-10-14 MED ORDER — METHOTREXATE FOR ECTOPIC PREGNANCY
50.0000 mg/m2 | Freq: Once | INTRAMUSCULAR | Status: AC
  Administered 2019-10-14: 95 mg via INTRAMUSCULAR
  Filled 2019-10-14 (×2): qty 1

## 2019-10-14 NOTE — Discharge Instructions (Signed)
Methotrexate Treatment for an Ectopic Pregnancy, Care After This sheet gives you information about how to care for yourself after your procedure. Your health care provider may also give you more specific instructions. If you have problems or questions, contact your health care provider. What can I expect after the procedure? After the procedure, it is common to have:  Abdominal cramping.  Vaginal bleeding.  Fatigue.  Nausea.  Vomiting.  Diarrhea. Blood tests will be taken at timed intervals for several days or weeks to check your pregnancy hormone levels. The blood tests will be done until the pregnancy hormone can no longer be detected in the blood. Follow these instructions at home: Activity  Do not have sex until your health care provider approves.  Limit activities that take a lot of effort as told by your health care provider. Medicines  Take over the counter and prescription medicines only as told by your health care provider.  Do not take aspirin, ibuprofen, naproxen, or any other NSAIDs.  Do not take folic acid, prenatal vitamins, or other vitamins that contain folic acid. General instructions   Do not drink alcohol.  Follow instructions from your health care provider on how and when to report any symptoms that may indicate a ruptured ectopic pregnancy.  Keep all follow-up visits as told by your health care provider. This is important. Contact a health care provider if:  You have persistent nausea and vomiting.  You have persistent diarrhea.  You are having a reaction to the medicine, such as: ? Tiredness. ? Skin rash. ? Hair loss. Get help right away if:  Your abdominal or pelvic pain gets worse.  You have more vaginal bleeding.  You feel light-headed or you faint.  You have shortness of breath.  Your heart rate increases.  You develop a cough.  You have chills.  You have a fever. Summary  After the procedure, it is common to have symptoms  of abdominal cramping, vaginal bleeding and fatigue. You may also experience other symptoms.  Blood tests will be taken at timed intervals for several days or weeks to check your pregnancy hormone levels. The blood tests will be done until the pregnancy hormone can no longer be detected in the blood.  Limit strenuous activity as told by your health care provider.  Follow instructions from your health care provider on how and when to report any symptoms that may indicate a ruptured ectopic pregnancy. This information is not intended to replace advice given to you by your health care provider. Make sure you discuss any questions you have with your health care provider. Document Revised: 05/21/2017 Document Reviewed: 07/28/2016 Elsevier Patient Education  2020 Elsevier Inc.  

## 2019-10-14 NOTE — MAU Note (Signed)
Mary Duran is a 34 y.o. at [redacted]w[redacted]d here in MAU reporting: here for day 4 labs post MTX. Denies pain or bleeding.  Pain score: 0/10  Vitals:   10/14/19 0818  BP: 119/75  Pulse: 68  Resp: 16  Temp: 99.1 F (37.3 C)  SpO2: 100%     Lab orders placed from triage: hcg order released

## 2019-10-14 NOTE — MAU Provider Note (Addendum)
Mary Duran  is a 34 y.o. G3P2002 at [redacted]w[redacted]d who presents to MAU today for day 4 follow-up quant hCG after Methotrexate. Previous quant hCG was 2138. [place] on 10/02/19 showed possible IUGS but no YS or FP.  She denies pain, vaginal bleeding or fever today.   OB History  Gravida Para Term Preterm AB Living  3 2 2  0 0 2  SAB TAB Ectopic Multiple Live Births          2    # Outcome Date GA Lbr Len/2nd Weight Sex Delivery Anes PTL Lv  3 Current           2 Term     F Vag-Spont  N LIV  1 Term     M Vag-Spont  N LIV    Obstetric Comments  Daughter in [place], son in [place]    Past Medical History:  Diagnosis Date  . Anxiety     ROS: no VB no pain   BP 119/75 (BP Location: Right Arm)   Pulse 68   Temp 99.1 F (37.3 C) (Oral)   Resp 16   LMP 09/24/2019   SpO2 100% Comment: room air  CONSTITUTIONAL: Well-developed, well-nourished female in no acute distress.  MUSCULOSKELETAL: Normal range of motion.  CARDIOVASCULAR: Regular heart rate RESPIRATORY: Normal effort NEUROLOGICAL: Alert and oriented to person, place, and time.  SKIN: Not diaphoretic. No erythema. No pallor. PSYCH: Normal mood and affect. Normal behavior. Normal judgment and thought content.  Results for orders placed or performed during the hospital encounter of 10/14/19 (from the past 24 hour(s))  hCG, quantitative, pregnancy     Status: Abnormal   Collection Time: 10/14/19  8:42 AM  Result Value Ref Range   hCG, Beta Chain, Quant, S 3,276 (H) <5 mIU/mL   MDM: Labs ordered. Pt needs to leave, cannot wait for results.  Discussed findings and labs with Dr. 10/16/19 who recommends second dose of MTX. Informed pt by phone and she agrees to return for injection.  1440: Pt returned and was counseled on MTX. She agrees to treatment and received injection.   A: 1. Ectopic pregnancy without intrauterine pregnancy, unspecified location     P: Discharge home Strict return precautions discussed Patient will  return for follow-up quant HCG in [facility] on 10/17/19 @0830  Patient may return to MAU as needed or if her condition were to change or worsen   10/19/19, 10/14/2019 11:37 AM

## 2019-10-14 NOTE — Progress Notes (Signed)
Pt returned for Methotrexate injection. See MAU provider note from earlier.

## 2019-10-14 NOTE — MAU Note (Signed)
Mary Duran is a 34 y.o. at [redacted]w[redacted]d here in MAU reporting: here for 2nd MTX injection.  Pain score: 0/10  Vitals:   10/14/19 1429  BP: 130/76  Pulse: 68  Resp: 17  Temp: 99.1 F (37.3 C)  SpO2: 100%     Lab orders placed from triage: none

## 2019-10-17 ENCOUNTER — Encounter: Payer: Self-pay | Admitting: *Deleted

## 2019-10-17 ENCOUNTER — Ambulatory Visit (INDEPENDENT_AMBULATORY_CARE_PROVIDER_SITE_OTHER): Payer: Non-veteran care | Admitting: *Deleted

## 2019-10-17 ENCOUNTER — Other Ambulatory Visit: Payer: Self-pay

## 2019-10-17 VITALS — BP 118/77 | HR 67 | Ht 63.0 in | Wt 181.0 lb

## 2019-10-17 DIAGNOSIS — O3680X0 Pregnancy with inconclusive fetal viability, not applicable or unspecified: Secondary | ICD-10-CM

## 2019-10-17 LAB — BETA HCG QUANT (REF LAB): hCG Quant: 2368 m[IU]/mL

## 2019-10-17 NOTE — Progress Notes (Signed)
Pt here for stat BHCG. This is Opal Dinning #4 following 2nd dose of MTX. Pt denies having any pain, cramping or vaginal bleeding. She is having increased breast tenderness. Pt is tearful regarding the pregnancy loss and states that she feels pregnant. Pt was offered appt for [facility] and declined. She was advised that if she changes her mind, she may contact the office to schedule appointment. Pt will be notified later today with test results and recommended plan of care. She voiced understanding of all information and instructions given.    1200 BHCG results (2368) reviewed w/Dr. Alysia Penna. He recommended pt to have stat BHCG on Jameison Haji #7 (4/30) per protocol. I called pt and her line was busy.   1640  Called pt and informed her of BHCG results. She was advised that these results are consistent with a failed pregnancy. Pt was instructed to go to MAU on Friday 4/30 for Stat BHCG Shelvy Heckert #7. Pt stated that she may have a problem with childcare. If she is not able to go on Friday, she will go on Saturday 5/1. Dr. Alysia Penna stated this is ok. Pt voiced understanding of all information and instructions given.

## 2019-10-18 NOTE — Progress Notes (Signed)
Agree with A & P. 

## 2019-10-20 ENCOUNTER — Inpatient Hospital Stay (HOSPITAL_COMMUNITY)
Admission: AD | Admit: 2019-10-20 | Discharge: 2019-10-20 | Disposition: A | Discharge status: Home / Self Care | Payer: No Typology Code available for payment source | Source: Ambulatory Visit | Attending: Obstetrics & Gynecology | Admitting: Obstetrics & Gynecology

## 2019-10-20 ENCOUNTER — Other Ambulatory Visit: Payer: Self-pay

## 2019-10-20 DIAGNOSIS — O009 Unspecified ectopic pregnancy without intrauterine pregnancy: Secondary | ICD-10-CM | POA: Insufficient documentation

## 2019-10-20 DIAGNOSIS — Z3A01 Less than 8 weeks gestation of pregnancy: Secondary | ICD-10-CM | POA: Insufficient documentation

## 2019-10-20 LAB — HCG, QUANTITATIVE, PREGNANCY: hCG, Beta Chain, Quant, S: 2342 m[IU]/mL — ABNORMAL HIGH (ref <5)

## 2019-10-20 NOTE — MAU Note (Signed)
Pt is here for bloodwork/hormone level- today is day 7 post 2nd dose MTX.  States feeling better today.  No longer bleeding or having any pain. Asking about going out of town next week, needs to check on Grandmother, advised pt to speak with provider.  Stressed importance of f/u, that we follow to zero- not out of the woods as still at risk for rupture- do not ignore severe pain.  Verbalized understanding.

## 2019-10-20 NOTE — MAU Provider Note (Signed)
Mary Duran  is a 34 y.o. G3P2002  at [redacted]w[redacted]d who presents to MAU today for follow-up quant hCG. The patient denies abdominal pain, vaginal bleeding, N/V or fever.  Today is day 7 s/p second dose of methotrexate.    BP 126/78 (BP Location: Right Arm)   Pulse 72   Temp 98.6 F (37 C) (Oral)   Resp 16   LMP 09/24/2019   SpO2 100%   GENERAL: Well-developed, well-nourished female in no acute distress.  HEENT: Normocephalic, atraumatic.   LUNGS: Effort normal HEART: Regular rate  SKIN: Warm, dry and without erythema PSYCH: Normal mood and affect  Results for orders placed or performed during the hospital encounter of 10/20/19 (from the past 24 hour(s))  hCG, quantitative, pregnancy     Status: Abnormal   Collection Time: 10/20/19  7:49 AM  Result Value Ref Range   hCG, Beta Chain, Quant, S 2,342 (H) <5 mIU/mL    MDM Patient without pain or bleeding.  Reviewed with Dr. Macon Large - minimal drop from a few days ago, but >25% drop since day 7 labs which is appropriate. Patient can start weekly HCGs until negative. Patient will be out of town next week, ok for weekly HCGs to start on 5/10.    A: 1. Ectopic pregnancy without intrauterine pregnancy, unspecified location    P: Discharge home Strict ectopic return precautions Msg to [facility] for weekly HCGs to start 5/10  Judeth Horn, NP  10/20/2019 8:02 AM

## 2019-10-20 NOTE — Discharge Instructions (Signed)
Return to care  If you have heavier bleeding that soaks through more that 2 pads per hour for an hour or more If you bleed so much that you feel like you might pass out or you do pass out If you have significant abdominal pain that is not improved with Tylenol   

## 2019-10-30 ENCOUNTER — Telehealth: Payer: Self-pay | Admitting: *Deleted

## 2019-10-30 NOTE — Telephone Encounter (Addendum)
Pt left VM message stating that she was supposed to have appt for hormone level but no one has contacted her. Pt further stated that she will be out of town tomorrow but can come in on 5/12.   1725  I returned pt's call and confirmed that she does need BHCG (non stat) level this week. I will have our scheduling staff make the appointment and she will be notified via MyChart. Pt requested appt in the afternoon.

## 2019-11-01 ENCOUNTER — Other Ambulatory Visit: Payer: Non-veteran care

## 2019-11-01 ENCOUNTER — Other Ambulatory Visit: Payer: Self-pay | Admitting: *Deleted

## 2019-11-01 DIAGNOSIS — O009 Unspecified ectopic pregnancy without intrauterine pregnancy: Secondary | ICD-10-CM

## 2019-11-02 ENCOUNTER — Other Ambulatory Visit: Payer: Non-veteran care

## 2019-11-02 ENCOUNTER — Other Ambulatory Visit: Payer: Self-pay

## 2019-11-02 DIAGNOSIS — O009 Unspecified ectopic pregnancy without intrauterine pregnancy: Secondary | ICD-10-CM

## 2019-11-03 LAB — BETA HCG QUANT (REF LAB): hCG Quant: 129 m[IU]/mL

## 2020-03-07 ENCOUNTER — Encounter: Payer: Self-pay | Admitting: Family Medicine

## 2020-03-14 ENCOUNTER — Encounter: Payer: Self-pay | Admitting: *Deleted

## 2020-03-14 DIAGNOSIS — Z3490 Encounter for supervision of normal pregnancy, unspecified, unspecified trimester: Secondary | ICD-10-CM

## 2020-03-14 DIAGNOSIS — O0990 Supervision of high risk pregnancy, unspecified, unspecified trimester: Secondary | ICD-10-CM | POA: Insufficient documentation

## 2020-03-14 DIAGNOSIS — Z8759 Personal history of other complications of pregnancy, childbirth and the puerperium: Secondary | ICD-10-CM | POA: Insufficient documentation

## 2020-03-27 ENCOUNTER — Telehealth (INDEPENDENT_AMBULATORY_CARE_PROVIDER_SITE_OTHER): Admitting: Family Medicine

## 2020-03-27 DIAGNOSIS — Z3201 Encounter for pregnancy test, result positive: Secondary | ICD-10-CM

## 2020-03-27 NOTE — Telephone Encounter (Signed)
New OB has intake on 10/08. She is wanting to come in and get blood work done.

## 2020-03-28 ENCOUNTER — Encounter

## 2020-03-28 NOTE — Telephone Encounter (Signed)
Mary Duran,   I don't think we need to change her new OB appointment. I do think we should get her an appointment with the diabetic education ASAP so that she can work on her diet and see if that improves her control. I think she should continue to check sugars at home fasting and 2 hour PP daily and bring her log to the new OB appointment next week. If there is availability we can move her up, but I don't think there will be any appointments for new OB before this time next week.   Raynelle Fanning

## 2020-03-28 NOTE — Telephone Encounter (Signed)
Returned patients call. Patient reports she is concerns as she has had a previously miscarriage in April.   When she found out she was pregnant she had a A1C that is elevated at 6.3. She is concerned. She has been checking her blood sugars a  home. She is an OB NP.   A1C was drawn by her PCP and is not visible in Epic.   Fasting 99-126. She is checking her blood sugars about 1-2 hours after meals. She has started eating smaller meals and adding snacks when she noted that her post prandial levels were are high as 160. They are 91-124 post prandial today.   She is exercising 5 days a week. She is not eating past 6.   Encouraged patient to drink a lot of water and eat a protein heavy snack before bed to see if it helps her fasting blood sugars.   She has had headaches, shakiness, and nausea at times.   Patient is requesting to come in early to get her OB labs drawn as she is concerned her blood sugars will be more out of control as the pregnancy progresses.   She is planning to check her blood sugars fasting and 2 hours post prandial between now and her New OB appointment. She will keep record and bring to [facility] appointment.   Will route to Vonzella Nipple, PA who will see patient next week.

## 2020-03-28 NOTE — Telephone Encounter (Signed)
Message sent to front office to schedule patient with Diabetes Education at first available appointment.

## 2020-03-29 ENCOUNTER — Telehealth (INDEPENDENT_AMBULATORY_CARE_PROVIDER_SITE_OTHER): Admitting: *Deleted

## 2020-03-29 ENCOUNTER — Other Ambulatory Visit: Payer: Self-pay

## 2020-03-29 DIAGNOSIS — R7303 Prediabetes: Secondary | ICD-10-CM | POA: Insufficient documentation

## 2020-03-29 DIAGNOSIS — Z8759 Personal history of other complications of pregnancy, childbirth and the puerperium: Secondary | ICD-10-CM

## 2020-03-29 DIAGNOSIS — R7309 Other abnormal glucose: Secondary | ICD-10-CM

## 2020-03-29 DIAGNOSIS — Z3490 Encounter for supervision of normal pregnancy, unspecified, unspecified trimester: Secondary | ICD-10-CM

## 2020-03-29 DIAGNOSIS — O09529 Supervision of elderly multigravida, unspecified trimester: Secondary | ICD-10-CM

## 2020-03-29 NOTE — Patient Instructions (Signed)
  At [facility], we work as an integrated team, providing care to address both physical and emotional health. Your medical provider may refer you to see our Behavioral Health Clinician (BHC) on the same day you see your medical provider, as availability permits.  Our BHC is available to all patients, visits generally last between 20-30 minutes, but can be longer or shorter, depending on patient need. The BHC offers help with stress management, coping with symptoms of depression and anxiety, major life changes , sleep issues, changing risky behavior, grief and loss, life stress, working on personal life goals, and  behavioral health issues, as these all affect your overall health and wellness.  The BHC is NOT available for the following: FMLA paperwork, court-ordered evaluations, specialty assessments (custody or disability), letters to employers, or obtaining certification for an emotional support animal. The BHC does not provide long-term therapy. You have the right to refuse integrated behavioral health services, or to reschedule to see the BHC at a later date.  Exception: If you are having thoughts of suicide, we require that you either see the BHC for further assessment, or contract for safety with your medical provider. Confidentiality exception: If it is suspected that a child or disabled adult is being abused or neglected, we are required by law to report that to either Child Protective Services or Adult Protective Services.  If you have a diagnosis of Bipolar affective disorder, Schizophrenia, or recurrent Major depressive disorder, we will recommend that you establish care with a psychiatrist, as these are lifelong, chronic conditions, and we want your overall emotional health and medications to be more closely monitored. If you anticipate needing extended maternity leave due to mental illness, it it recommended you inform your medical provider, so  we can put in a referral to a  psychiatrist as soon as possible. The BHC is unable to recommend an extended maternity leave for mental health issues. Your medical provider or BHC may refer you to a therapist for ongoing, traditional therapy, or to a psychiatrist, for medication management, if it would benefit your overall health. Depending on your insurance, you may have a copay to see the BHC. If you are uninsured, it is recommended that you apply for financial assistance. (Forms may be requested at the front desk for in-person visits, via MyChart, or request a form during a virtual visit).  If you see the BHC more than 6 times, you will have to complete a comprehensive clinical assessment interview with the BHC to resume integrated services.  For virtual visits with the BHC, you must be physically in the state of [place] at the time of the visit. For example, if you live in [place], you will have to do an in-person visit with the BHC. If you are going out of the state or country for any reason, the BHC may see you virtually when you return to [place], but not while you are physically outside of [place].    

## 2020-03-29 NOTE — Progress Notes (Signed)
Chart reviewed for nurse visit. Agree with plan of care.   Marny Lowenstein, PA-C 03/29/2020 9:35 AM

## 2020-03-29 NOTE — Progress Notes (Signed)
New OB Intake  I connected with  Mary Duran on 03/29/20 at  8:15 AM EDT by MyChart and verified that I am speaking with the correct person using two identifiers. Nurse is located at [facility] and pt is located at home.  I discussed the limitations, risks, security and privacy concerns of performing an evaluation and management service by telephone and the availability of in person appointments. I also discussed with the patient that there may be a patient responsible charge related to this service. The patient expressed understanding and agreed to proceed.  I explained I am completing New OB Intake today. We discussed her EDD of 10/22/2020 that is based on LMP of 01/16/20. Pt is G4/P2012. I reviewed her allergies, medications, Medical/Surgical/OB history, and appropriate screenings. I informed her of [facility] services. Based on history, this is a/an complicated by AMA, history of ectopic pregnancy pregnancy. She also reports she has a history of elevated hgA1c and has started checked her blood sugars. She has an appointment with Diabetes education 04/04/20 in our office.   Concerns addressed today  MyChart/Babyscripts MyChart access verified. I explained pt will have some visits in office and some virtually. Babyscripts instructions given.  She will complete registration and download.   Blood Pressure Cuff Patient has private insurance; instructed to purchase blood pressure cuff and bring to first prenatal appt. Explained after first prenatal appt pt will check weekly and document in Babyscripts.  Anatomy US Explained first scheduled [place] will be around 19 weeks. Anatomy US scheduled for 05/30/20 at 0830. Pt notified to arrive at 0815.  Labs Discussed Avelina Laine genetic screening with patient. She is undecided about genetic screening. Routine prenatal labs needed- she asked if they could be drawn before her new ob visit. I informed yes her we can and asked if she would like to do with her Diabetes  appointment and she agreed to that plan. I explained registrar will contact her with that appointment. She will sign a release for pap done in 2020.   First visit review I reviewed new OB appt with pt. I explained she will have a pelvic exam, ob bloodwork  If not already done with genetic screening if desired, Explained pt will be seen by Vonzella Nipple, PA at first visit; encounter routed to appropriate provider. She has had COVID vaccine and will bring proof to her appointment or have it faxed.   Mary Darwish,RN 03/29/2020  8:19 AM

## 2020-04-04 ENCOUNTER — Ambulatory Visit: Admitting: Registered"

## 2020-04-04 ENCOUNTER — Other Ambulatory Visit

## 2020-04-04 ENCOUNTER — Other Ambulatory Visit: Payer: Self-pay

## 2020-04-04 ENCOUNTER — Encounter: Admitting: Student

## 2020-04-04 ENCOUNTER — Encounter: Attending: Medical | Admitting: Registered"

## 2020-04-04 DIAGNOSIS — R7309 Other abnormal glucose: Secondary | ICD-10-CM

## 2020-04-04 DIAGNOSIS — Z3490 Encounter for supervision of normal pregnancy, unspecified, unspecified trimester: Secondary | ICD-10-CM

## 2020-04-04 DIAGNOSIS — R739 Hyperglycemia, unspecified: Secondary | ICD-10-CM | POA: Insufficient documentation

## 2020-04-04 DIAGNOSIS — Z8759 Personal history of other complications of pregnancy, childbirth and the puerperium: Secondary | ICD-10-CM

## 2020-04-04 DIAGNOSIS — O09529 Supervision of elderly multigravida, unspecified trimester: Secondary | ICD-10-CM

## 2020-04-04 NOTE — Progress Notes (Signed)
Patient was seen on 04/04/20 for Gestational Diabetes self-management. EDD 10/22/20. Patient states no history of GDM. Diet history obtained. Patient eats variety of all food groups. Beverages include water.  Patient reports structured physical activity.  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 Pt reports she has had elevated A1c (6.0% & 5.8%) in previous pregnancies but was not diagnosed with GDM. Pt reports because A1C at PCP visit was 6.3% she has started self monitoring blood sugar. Patient states she is being mindful of limiting weight gain and has goal of less than 15 lbs. Pt is also in the [employer] and is very active.  Pt states she is following the recommended carb intake for GDM.  The following learning objectives were met by the patient :   States the definition of Gestational Diabetes  States why dietary management is important in controlling blood glucose  Describes the effects of carbohydrates on blood glucose levels  Demonstrates ability to create a balanced meal plan  Demonstrates carbohydrate counting   States when to check blood glucose levels  Demonstrates proper blood glucose monitoring techniques  States the effect of stress and exercise on blood glucose levels  States the importance of limiting caffeine and abstaining from alcohol and smoking  Plan:  Aim for 3 Carbohydrate Choices per meal (45 grams) +/- 1 either way  Aim for 1-2 Carbohydrate Choices per snack Begin reading food labels for Total Carbohydrate of foods If OK with your MD, consider  increasing your activity level by walking, Arm Chair Exercises or other activity daily as tolerated Begin checking Blood Glucose before breakfast and 2 hours after first bite of breakfast, lunch and dinner as directed by MD  Bring Log Book/Sheet and meter to every medical appointment  Baby Scripts: Patient was introduced to [employer] and  plans to use as  record of blood glucose electronically  Take medication if directed by MD  Patient already has a meter, is testing pre breakfast and 2 hours after each meal. Review of Log Book shows:  FBS mostly above target (~100 mg/dL)*; post prandial mostly WNL  *Pt states she is checking FBS after physical activity which due to intensity could be elevating her readings. Pt will start checking prior to exercise.  Patient instructed to monitor glucose levels: FBS: 60 - 95 mg/dl 2 hour: <120 mg/dl  Patient received the following handouts:  Nutrition Diabetes and Pregnancy  Carbohydrate Counting List  Blood glucose Log Sheet  Patient will be seen for follow-up as needed.

## 2020-04-04 NOTE — Patient Instructions (Signed)
Checking fasting blood sugar before exercise If continues to be elevated, 10 min of moderate exercise in evening Work on finding light meals that are more balanced with protein for evening meals when not very hungry Call your PCP for meter Rx if you want to

## 2020-04-05 ENCOUNTER — Encounter: Payer: Self-pay | Admitting: Medical

## 2020-04-05 ENCOUNTER — Ambulatory Visit (INDEPENDENT_AMBULATORY_CARE_PROVIDER_SITE_OTHER): Admitting: Medical

## 2020-04-05 VITALS — BP 114/76 | HR 75 | Wt 176.4 lb

## 2020-04-05 DIAGNOSIS — Z3491 Encounter for supervision of normal pregnancy, unspecified, first trimester: Secondary | ICD-10-CM

## 2020-04-05 DIAGNOSIS — Z3A11 11 weeks gestation of pregnancy: Secondary | ICD-10-CM

## 2020-04-05 DIAGNOSIS — R7309 Other abnormal glucose: Secondary | ICD-10-CM

## 2020-04-05 DIAGNOSIS — O09521 Supervision of elderly multigravida, first trimester: Secondary | ICD-10-CM

## 2020-04-05 LAB — CBC/D/PLT+RPR+RH+ABO+RUB AB...
Antibody Screen: NEGATIVE
Basophils Absolute: 0 10*3/uL (ref 0.0–0.2)
Basos: 0 %
EOS (ABSOLUTE): 0.1 10*3/uL (ref 0.0–0.4)
Eos: 1 %
HCV Ab: 0.1 s/co ratio (ref 0.0–0.9)
HIV Screen 4th Generation wRfx: NONREACTIVE
Hematocrit: 33.9 % — ABNORMAL LOW (ref 34.0–46.6)
Hemoglobin: 11.7 g/dL (ref 11.1–15.9)
Hepatitis B Surface Ag: NEGATIVE
Immature Grans (Abs): 0 10*3/uL (ref 0.0–0.1)
Immature Granulocytes: 0 %
Lymphocytes Absolute: 1.8 10*3/uL (ref 0.7–3.1)
Lymphs: 21 %
MCH: 25.5 pg — ABNORMAL LOW (ref 26.6–33.0)
MCHC: 34.5 g/dL (ref 31.5–35.7)
MCV: 74 fL — ABNORMAL LOW (ref 79–97)
Monocytes Absolute: 0.5 10*3/uL (ref 0.1–0.9)
Monocytes: 5 %
Neutrophils Absolute: 6.4 10*3/uL (ref 1.4–7.0)
Neutrophils: 73 %
Platelets: 223 10*3/uL (ref 150–450)
RBC: 4.58 x10E6/uL (ref 3.77–5.28)
RDW: 14.3 % (ref 11.7–15.4)
RPR Ser Ql: NONREACTIVE
Rh Factor: POSITIVE
Rubella Antibodies, IGG: 7.24 index (ref >0.99)
WBC: 8.8 10*3/uL (ref 3.4–10.8)

## 2020-04-05 LAB — HCV INTERPRETATION

## 2020-04-05 LAB — HEMOGLOBIN A1C
Est. average glucose Bld gHb Est-mCnc: 128 mg/dL
Hgb A1c MFr Bld: 6.1 % — ABNORMAL HIGH (ref 4.8–5.6)

## 2020-04-05 NOTE — Patient Instructions (Signed)

## 2020-04-05 NOTE — Progress Notes (Signed)
Some slight bleeding after sex.

## 2020-04-05 NOTE — Progress Notes (Signed)
   PRENATAL VISIT NOTE  Subjective:  Mary Duran is a 34 y.o. Q6S3419 at [redacted]w[redacted]d being seen today for her first prenatal visit for this pregnancy.  She is currently monitored for the following issues for this low-risk pregnancy and has Supervision of low-risk pregnancy; History of ectopic pregnancy; AMA (advanced maternal age) multigravida 35+; and Elevated hemoglobin A1c on their problem list.  Patient reports spotting after intercourse, hyperglycemia.  Contractions: Not present. Vag. Bleeding: None.  Movement: Absent. Denies leaking of fluid.   She is planning to breastfeed. Desires IUD for contraception.   The following portions of the patient's history were reviewed and updated as appropriate: allergies, current medications, past family history, past medical history, past social history, past surgical history and problem list.   Objective:   Vitals:   04/05/20 0923  BP: 114/76  Pulse: 75  Weight: 176 lb 6.4 oz (80 kg)    Fetal Status: Fetal Heart Rate (bpm): 174   Movement: Absent     General:  Alert, oriented and cooperative. Patient is in no acute distress.  Skin: Skin is warm and dry. No rash noted.   Cardiovascular: Normal heart rate and rhythm noted  Respiratory: Normal respiratory effort, no problems with respiration noted. Clear to auscultation.   Abdomen: Soft, gravid, appropriate for gestational age. Normal bowel sounds. Non-tender. Pain/Pressure: Absent     Pelvic: Deferred  Extremities: Normal range of motion.  Edema: None  Mental Status: Normal mood and affect. Normal behavior. Normal judgment and thought content.   Assessment and Plan:  Pregnancy: [id number] at [redacted]w[redacted]d 1. Encounter for supervision of low-risk pregnancy in first trimester - New OB labs drawn yesterday and reviewed with patient - Culture, OB Urine - Discussed post-coital spotting can be normal, first trimester warning signs reviewed - Nausea from earlier in first trimester has resolved - Continued  fatigue is often normal in first trimester, Hgb normal yesterday, continue PNV - Last pap normal 08/2018 at [facility] in [place], ROI requested  - Anatomy US scheduled 12/9 - Declined genetic testing  2. Multigravida of advanced maternal age in first trimester - Will be 35 at time of delivery   3. Elevated hemoglobin A1c - 6.1 yesterday, patient has seen diabetes education and is checking CBGs - Fasting values slightly elevated, patient has plan to check prior to exercise in the morning and re-evaluate at next visit  - Recommend GTT, patient will schedule, not fasting today  4. [redacted] weeks gestation of pregnancy  Preterm labor/first trimester warning symptoms and general obstetric precautions including but not limited to vaginal bleeding, contractions, leaking of fluid and fetal movement were reviewed in detail with the patient. Please refer to After Visit Summary for other counseling recommendations.   Discussed the normal visit cadence for prenatal care Discussed the nature of our practice with multiple providers including residents and students   Return in about 4 weeks (around 05/03/2020) for LOB, In-Person.  Future Appointments  Date Time Provider Department Center  04/11/2020  8:20 AM [facility] LAB [facility]  05/10/2020  9:35 AM Kathlene Cote [facility]  05/30/2020  8:15 AM [facility] NURSE [facility]  05/30/2020  8:30 AM [facility] US3 [facility]    Vonzella Nipple, PA-C

## 2020-04-06 ENCOUNTER — Encounter: Payer: Self-pay | Admitting: Medical

## 2020-04-07 LAB — URINE CULTURE, OB REFLEX: Organism ID, Bacteria: NO GROWTH

## 2020-04-07 LAB — CULTURE, OB URINE

## 2020-04-10 ENCOUNTER — Other Ambulatory Visit: Payer: Self-pay | Admitting: General Practice

## 2020-04-10 DIAGNOSIS — Z3491 Encounter for supervision of normal pregnancy, unspecified, first trimester: Secondary | ICD-10-CM

## 2020-04-11 ENCOUNTER — Other Ambulatory Visit

## 2020-04-11 ENCOUNTER — Other Ambulatory Visit: Payer: Self-pay | Admitting: *Deleted

## 2020-04-11 ENCOUNTER — Other Ambulatory Visit: Payer: Self-pay

## 2020-04-11 DIAGNOSIS — R7309 Other abnormal glucose: Secondary | ICD-10-CM

## 2020-04-11 DIAGNOSIS — Z3491 Encounter for supervision of normal pregnancy, unspecified, first trimester: Secondary | ICD-10-CM

## 2020-04-12 LAB — GLUCOSE TOLERANCE, 2 HOURS W/ 1HR
Glucose, 1 hour: 108 mg/dL (ref 65–179)
Glucose, 2 hour: 122 mg/dL (ref 65–152)
Glucose, Fasting: 79 mg/dL (ref 65–91)

## 2020-04-29 ENCOUNTER — Telehealth: Payer: Self-pay

## 2020-04-29 NOTE — Telephone Encounter (Addendum)
Called pt in regards to her not logging her blood sugars into Babyscripts.  Pt reports that she had passed her glucose tolerance test so she thought that there was no need for her to continue to check her blood sugars.  I informed pt that I would reach out to the provider to verify and then get back with her.  Pt verbalized understanding.    Addison Naegeli, RN  04/30/20

## 2020-05-06 NOTE — Telephone Encounter (Signed)
Reviewed concern with Dr. Earlene Plater, who states that pt does not need to check BS at this time knowing that she passed her early 1 hr.  Pt notified via MyChart.  Addison Naegeli, RN  05/06/20

## 2020-05-10 ENCOUNTER — Ambulatory Visit (INDEPENDENT_AMBULATORY_CARE_PROVIDER_SITE_OTHER): Admitting: Medical

## 2020-05-10 ENCOUNTER — Other Ambulatory Visit (HOSPITAL_COMMUNITY)
Admission: RE | Admit: 2020-05-10 | Discharge: 2020-05-10 | Disposition: A | Discharge status: Home / Self Care | Source: Ambulatory Visit | Attending: Medical | Admitting: Medical

## 2020-05-10 VITALS — BP 112/68 | HR 72 | Wt 180.0 lb

## 2020-05-10 DIAGNOSIS — Z3492 Encounter for supervision of normal pregnancy, unspecified, second trimester: Secondary | ICD-10-CM

## 2020-05-10 DIAGNOSIS — N898 Other specified noninflammatory disorders of vagina: Secondary | ICD-10-CM

## 2020-05-10 DIAGNOSIS — R7309 Other abnormal glucose: Secondary | ICD-10-CM

## 2020-05-10 DIAGNOSIS — O09522 Supervision of elderly multigravida, second trimester: Secondary | ICD-10-CM

## 2020-05-10 DIAGNOSIS — O26892 Other specified pregnancy related conditions, second trimester: Secondary | ICD-10-CM | POA: Diagnosis not present

## 2020-05-10 DIAGNOSIS — Z3A16 16 weeks gestation of pregnancy: Secondary | ICD-10-CM

## 2020-05-10 NOTE — Patient Instructions (Signed)

## 2020-05-10 NOTE — Progress Notes (Signed)
° °  PRENATAL VISIT NOTE  Subjective:  Mary Duran is a 34 y.o. U2V2536 at [redacted]w[redacted]d being seen today for ongoing prenatal care.  She is currently monitored for the following issues for this high-risk pregnancy and has Supervision of low-risk pregnancy; History of ectopic pregnancy; AMA (advanced maternal age) multigravida 35+; and Elevated hemoglobin A1c on their problem list.  Patient reports backache and occasional tightening of lower abdomen.  Contractions: Not present. Vag. Bleeding: None.  Movement: Absent. Denies leaking of fluid.   The following portions of the patient's history were reviewed and updated as appropriate: allergies, current medications, past family history, past medical history, past social history, past surgical history and problem list.   Objective:   Vitals:   05/10/20 0952  BP: 112/68  Pulse: 72  Weight: 180 lb (81.6 kg)    Fetal Status: Fetal Heart Rate (bpm): 150   Movement: Absent     General:  Alert, oriented and cooperative. Patient is in no acute distress.  Skin: Skin is warm and dry. No rash noted.   Cardiovascular: Normal heart rate noted  Respiratory: Normal respiratory effort, no problems with respiration noted  Abdomen: Soft, gravid, appropriate for gestational age.  Pain/Pressure: Absent     Pelvic: Cervical exam deferred        Extremities: Normal range of motion.  Edema: None  Mental Status: Normal mood and affect. Normal behavior. Normal judgment and thought content.   Assessment and Plan:  Pregnancy: [id number] at [redacted]w[redacted]d 1. Encounter for supervision of low-risk pregnancy in second trimester - Discussed early 2 hour results - normal  - Plan to repeat at 28 weeks  - US anatomy scheduled 12/9  2. Multigravida of advanced maternal age in second trimester - < 40 - no change in management   3. Elevated hemoglobin A1c - Normal early 2 hour  4. Vaginal discharge during pregnancy in second trimester - Cervicovaginal ancillary only( [facility]) - self swab  5. Back pain in pregnancy, second trimester - Discussed abdominal binder, tylenol, hydrotherapy or heat/ice for pain relief - Patient asked if she could try lidocaine patch, advised that would be ok but to avoid tiger balm and icy hot  5. [redacted] weeks gestation of pregnancy  Preterm labor symptoms and general obstetric precautions including but not limited to vaginal bleeding, contractions, leaking of fluid and fetal movement were reviewed in detail with the patient. Please refer to After Visit Summary for other counseling recommendations.   Return in about 4 weeks (around 06/07/2020) for LOB, any provider.  Future Appointments  Date Time Provider Department Center  05/30/2020  8:15 AM [facility] NURSE [facility]  05/30/2020  8:30 AM [facility] US3 [facility]  06/07/2020  8:35 AM Sharyon Cable, CNM [facility]    Vonzella Nipple, PA-C

## 2020-05-10 NOTE — Progress Notes (Signed)
Patient reports increased vaginal discharge with a musky odor for the past few weeks

## 2020-05-13 LAB — CERVICOVAGINAL ANCILLARY ONLY
Bacterial Vaginitis (gardnerella): NEGATIVE
Candida Glabrata: NEGATIVE
Candida Vaginitis: NEGATIVE
Comment: NEGATIVE
Comment: NEGATIVE
Comment: NEGATIVE

## 2020-05-30 ENCOUNTER — Ambulatory Visit: Payer: No Typology Code available for payment source | Attending: Medical

## 2020-05-30 ENCOUNTER — Ambulatory Visit: Payer: No Typology Code available for payment source | Admitting: *Deleted

## 2020-05-30 ENCOUNTER — Encounter: Payer: Self-pay | Admitting: *Deleted

## 2020-05-30 ENCOUNTER — Other Ambulatory Visit: Payer: Self-pay | Admitting: *Deleted

## 2020-05-30 ENCOUNTER — Other Ambulatory Visit: Payer: Self-pay

## 2020-05-30 DIAGNOSIS — Z8759 Personal history of other complications of pregnancy, childbirth and the puerperium: Secondary | ICD-10-CM

## 2020-05-30 DIAGNOSIS — O09529 Supervision of elderly multigravida, unspecified trimester: Secondary | ICD-10-CM | POA: Diagnosis present

## 2020-05-30 DIAGNOSIS — R7309 Other abnormal glucose: Secondary | ICD-10-CM | POA: Insufficient documentation

## 2020-05-30 DIAGNOSIS — Z362 Encounter for other antenatal screening follow-up: Secondary | ICD-10-CM

## 2020-05-30 DIAGNOSIS — Z3490 Encounter for supervision of normal pregnancy, unspecified, unspecified trimester: Secondary | ICD-10-CM | POA: Diagnosis not present

## 2020-06-07 ENCOUNTER — Other Ambulatory Visit: Payer: Self-pay

## 2020-06-07 ENCOUNTER — Ambulatory Visit (INDEPENDENT_AMBULATORY_CARE_PROVIDER_SITE_OTHER): Admitting: Certified Nurse Midwife

## 2020-06-07 ENCOUNTER — Encounter: Payer: Self-pay | Admitting: Certified Nurse Midwife

## 2020-06-07 VITALS — BP 100/63 | HR 82 | Wt 181.5 lb

## 2020-06-07 DIAGNOSIS — O09522 Supervision of elderly multigravida, second trimester: Secondary | ICD-10-CM

## 2020-06-07 DIAGNOSIS — Z3492 Encounter for supervision of normal pregnancy, unspecified, second trimester: Secondary | ICD-10-CM

## 2020-06-07 DIAGNOSIS — Z3A20 20 weeks gestation of pregnancy: Secondary | ICD-10-CM

## 2020-06-07 NOTE — Patient Instructions (Signed)

## 2020-06-07 NOTE — Progress Notes (Signed)
   PRENATAL VISIT NOTE  Subjective:  Mary Duran is a 34 y.o. Q0H4742 at [redacted]w[redacted]d being seen today for ongoing prenatal care.  She is currently monitored for the following issues for this low-risk pregnancy and has Supervision of low-risk pregnancy; History of ectopic pregnancy; AMA (advanced maternal age) multigravida 35+; and Elevated hemoglobin A1c on their problem list.  Patient reports no complaints.  Contractions: Not present. Vag. Bleeding: None.  Movement: Present. Denies leaking of fluid.   The following portions of the patient's history were reviewed and updated as appropriate: allergies, current medications, past family history, past medical history, past social history, past surgical history and problem list.   Objective:   Vitals:   06/07/20 0836  BP: 100/63  Pulse: 82  Weight: 181 lb 8 oz (82.3 kg)    Fetal Status: Fetal Heart Rate (bpm): 145 Fundal Height: 21 cm Movement: Present     General:  Alert, oriented and cooperative. Patient is in no acute distress.  Skin: Skin is warm and dry. No rash noted.   Cardiovascular: Normal heart rate noted  Respiratory: Normal respiratory effort, no problems with respiration noted  Abdomen: Soft, gravid, appropriate for gestational age.  Pain/Pressure: Present     Pelvic: Cervical exam deferred        Extremities: Normal range of motion.  Edema: None  Mental Status: Normal mood and affect. Normal behavior. Normal judgment and thought content.   Assessment and Plan:  Pregnancy: [id number] at [redacted]w[redacted]d 1. Encounter for supervision of low-risk pregnancy in second trimester - Patient doing well, no complaints  - patient reports back pain is getting better with recommendations that was given and husband is helping with stretches and massages. Educated and discussed chiropractic is safe during pregnancy- recommended Editor, commissioning  - Patient has questions regarding Covid booster during pregnancy, educated and discussed  recommendations of covid vaccine during pregnancy.   2. Multigravida of advanced maternal age in second trimester - 34 yo at time of delivery, no antenatal screening needed   3. [redacted] weeks gestation of pregnancy  Preterm labor symptoms and general obstetric precautions including but not limited to vaginal bleeding, contractions, leaking of fluid and fetal movement were reviewed in detail with the patient. Please refer to After Visit Summary for other counseling recommendations.   Return in about 4 weeks (around 07/05/2020) for LROB, in person.  Future Appointments  Date Time Provider Department Center  06/28/2020  2:45 PM [facility] NURSE [facility]  06/28/2020  3:00 PM [facility] US1 [facility]    Sharyon Cable, CNM

## 2020-06-22 NOTE — L&D Delivery Note (Signed)
OB/GYN Faculty Practice Delivery Note  Mary Duran is a 35 y.o. [id number] s/p NSVD at [redacted]w[redacted]d. She was admitted for IOL for gestational hypertension.   ROM: 2h 66m with clear fluid GBS Status: NEG Negative/-- (04/08 0815) Maximum Maternal Temperature: 98.28F  Labor Progress: . Initial SVE: 2.5/50/-2. After pitocin and AROM, she then progressed to complete.   Delivery Date/Time: 10/01/2020 @ 13:35 Delivery: Called to room and patient was complete and pushing. Head delivered ROA. No nuchal cord present. Shoulder and body delivered in usual fashion. Infant with spontaneous cry, placed on mother's abdomen, dried and stimulated. Cord clamped x 2 after 1-minute delay, and cut by FOB. Cord blood drawn. Placenta delivered spontaneously with gentle cord traction. Fundus firm with massage and Pitocin. Labia, perineum, vagina, and cervix inspected inspected with 1st degree perineal laceration.  Baby Weight: pending  Placenta: Sent to pathology Complications: None Lacerations: 1st degree perineal laceration EBL: 200 mL Analgesia: NONE (local anesthesia for repair)   Infant:  APGAR (1 MIN): 9   APGAR (5 MINS): 9   APGAR (10 MINS):     Jen Mow, DO OB [facility] [employer], [facility] 10/01/2020, 2:06 PM

## 2020-06-28 ENCOUNTER — Ambulatory Visit: Admitting: *Deleted

## 2020-06-28 ENCOUNTER — Ambulatory Visit: Attending: Obstetrics and Gynecology

## 2020-06-28 ENCOUNTER — Encounter: Payer: Self-pay | Admitting: *Deleted

## 2020-06-28 ENCOUNTER — Other Ambulatory Visit: Payer: Self-pay

## 2020-06-28 DIAGNOSIS — O09522 Supervision of elderly multigravida, second trimester: Secondary | ICD-10-CM | POA: Diagnosis not present

## 2020-06-28 DIAGNOSIS — Z362 Encounter for other antenatal screening follow-up: Secondary | ICD-10-CM | POA: Diagnosis present

## 2020-06-28 DIAGNOSIS — O402XX0 Polyhydramnios, second trimester, not applicable or unspecified: Secondary | ICD-10-CM

## 2020-06-28 DIAGNOSIS — Z8759 Personal history of other complications of pregnancy, childbirth and the puerperium: Secondary | ICD-10-CM

## 2020-06-28 DIAGNOSIS — R7309 Other abnormal glucose: Secondary | ICD-10-CM | POA: Insufficient documentation

## 2020-06-28 DIAGNOSIS — O09292 Supervision of pregnancy with other poor reproductive or obstetric history, second trimester: Secondary | ICD-10-CM

## 2020-06-28 DIAGNOSIS — Z3A23 23 weeks gestation of pregnancy: Secondary | ICD-10-CM

## 2020-07-01 ENCOUNTER — Other Ambulatory Visit: Payer: Self-pay | Admitting: *Deleted

## 2020-07-01 DIAGNOSIS — O409XX0 Polyhydramnios, unspecified trimester, not applicable or unspecified: Secondary | ICD-10-CM

## 2020-07-08 ENCOUNTER — Encounter: Admitting: Obstetrics and Gynecology

## 2020-07-12 ENCOUNTER — Telehealth (INDEPENDENT_AMBULATORY_CARE_PROVIDER_SITE_OTHER): Admitting: Obstetrics and Gynecology

## 2020-07-12 DIAGNOSIS — Z3A25 25 weeks gestation of pregnancy: Secondary | ICD-10-CM

## 2020-07-12 DIAGNOSIS — O99810 Abnormal glucose complicating pregnancy: Secondary | ICD-10-CM

## 2020-07-12 DIAGNOSIS — O09522 Supervision of elderly multigravida, second trimester: Secondary | ICD-10-CM

## 2020-07-12 DIAGNOSIS — R002 Palpitations: Secondary | ICD-10-CM

## 2020-07-12 DIAGNOSIS — Z8759 Personal history of other complications of pregnancy, childbirth and the puerperium: Secondary | ICD-10-CM

## 2020-07-12 DIAGNOSIS — Z3492 Encounter for supervision of normal pregnancy, unspecified, second trimester: Secondary | ICD-10-CM

## 2020-07-12 DIAGNOSIS — O0912 Supervision of pregnancy with history of ectopic or molar pregnancy, second trimester: Secondary | ICD-10-CM

## 2020-07-12 DIAGNOSIS — R7309 Other abnormal glucose: Secondary | ICD-10-CM

## 2020-07-12 DIAGNOSIS — O99891 Other specified diseases and conditions complicating pregnancy: Secondary | ICD-10-CM

## 2020-07-12 NOTE — Progress Notes (Signed)
OBSTETRICS PRENATAL VIRTUAL VISIT ENCOUNTER NOTE  Provider location: [facility] for [facility]   I connected with Mary Duran on 07/12/20 at  8:30 AM EST by MyChart Video Encounter at home and verified that I am speaking with the correct person using two identifiers.   I discussed the limitations, risks, security and privacy concerns of performing an evaluation and management service virtually and the availability of in person appointments. I also discussed with the patient that there may be a patient responsible charge related to this service. The patient expressed understanding and agreed to proceed. Subjective:  Mary Duran is a 35 y.o. [id number] at [redacted]w[redacted]d being seen today for ongoing prenatal care.  She is currently monitored for the following issues for this low-risk pregnancy and has Supervision of low-risk pregnancy; History of ectopic pregnancy; AMA (advanced maternal age) multigravida 35+; Elevated hemoglobin A1c; and Intermittent palpitations on their problem list.  Patient reports occasional palpitations.  Contractions: Irritability. Vag. Bleeding: None.  Movement: Present. Denies any leaking of fluid.   Discussed questions regarding recent MFM scan.  Discussed polyhydramnios.   Pt also notes intermittent tachycardia to the 120's.  She denies dizziness or passing out, but she can definitely feel it.  Pt desires further evaluation.  The following portions of the patient's history were reviewed and updated as appropriate: allergies, current medications, past family history, past medical history, past social history, past surgical history and problem list.   Objective:   Vitals:   07/12/20 0851  BP: 113/70  Pulse: 85    Fetal Status:     Movement: Present     General:  Alert, oriented and cooperative. Patient is in no acute distress.  Respiratory: Normal respiratory effort, no problems with respiration noted  Mental Status: Normal mood and  affect. Normal behavior. Normal judgment and thought content.  Rest of physical exam deferred due to type of encounter  Imaging: [place] MFM OB FOLLOW UP  Result Date: 06/28/2020 ----------------------------------------------------------------------  OBSTETRICS REPORT                       (Signed Final 06/28/2020 04:19 pm) ---------------------------------------------------------------------- Patient Info  ID #:       [id number]                          D.O.B.:  1986/06/15 (34 yrs)  Name:       Mary Duran               Visit Date: 06/28/2020 03:22 pm ---------------------------------------------------------------------- Performed By  Attending:        Lin Landsman      Ref. Address:     [address]                    MD                                                             [address] [place],  [place] [id number]  Performed By:     Truitt Leepiana Strickland,      [facility]                                [facility]  Referred By:      Marny LowensteinJULIE N WENZEL                    [place] ---------------------------------------------------------------------- Orders  #  Description                           Code        Ordered By  1  US MFM OB FOLLOW UP                   [id number]    Rosana HoesYU FANG ----------------------------------------------------------------------  #  Order #                     Accession #                Episode #  1  [id number]                   [id number]                 [id number] ---------------------------------------------------------------------- Indications  Advanced maternal age multigravida 4335+,        3O09.522  second trimester  Polyhydramnios, second trimester,              O40.2XX1  antepartum condition or complication, fetus 1  Prior poor obstetrical history  antepartum,     O09.292  second trimester (ectopic, anxiety)  Encounter for other antenatal screening        Z36.2  follow-up (declined testing)  [redacted] weeks gestation of pregnancy                Z3A.23 ---------------------------------------------------------------------- Fetal Evaluation  Num Of Fetuses:         1  Cardiac Activity:       Observed  Presentation:           Cephalic  Placenta:               Anterior  P. Cord Insertion:      Visualized, central  Amniotic Fluid  AFI FV:      Polyhydramnios  Largest Pocket(cm)                              10.61 ---------------------------------------------------------------------- Biometry  BPD:      62.5  mm     G. Age:  25w 2d         96  %    CI:         79.6   %    70 - 86                                                          FL/HC:      19.3   %    19.2 - 20.8  HC:      221.4  mm     G. Age:  24w 1d         64  %    HC/AC:      1.11        1.05 - 1.21  AC:      199.8  mm     G. Age:  24w 4d         78  %    FL/BPD:     68.3   %    71 - 87  FL:       42.7  mm     G. Age:  24w 0d         56  %    FL/AC:      21.4   %    20 - 24  Est. FW:     689  gm      1 lb 8 oz     85  % ---------------------------------------------------------------------- OB History  Gravidity:    4  Living:       2 ---------------------------------------------------------------------- Gestational Age  LMP:           23w 3d        Date:  01/16/20                 EDD:   10/22/20  U/S Today:     24w 4d                                        EDD:   10/14/20  Best:          23w 3d     Det. By:  LMP  (01/16/20)          EDD:   10/22/20 ---------------------------------------------------------------------- Anatomy  Cranium:               Appears normal         LVOT:                   Appears normal  Cavum:                 Appears normal         Aortic Arch:            Appears normal  Ventricles:            Appears normal  Ductal Arch:            Appears normal   Choroid Plexus:        Appears normal         Diaphragm:              Appears normal  Cerebellum:            Appears normal         Stomach:                Appears normal, left                                                                        sided  Posterior Fossa:       Appears normal         Abdomen:                Previously seen  Nuchal Fold:           Not applicable (>20    Abdominal Wall:         Appears nml (cord                         wks GA)                                        insert, abd wall)  Face:                  Appears normal         Cord Vessels:           Previously seen                         (orbits and profile)  Lips:                  Appears normal         Kidneys:                Appear normal  Palate:                Appears normal         Bladder:                Appears normal  Thoracic:              Appears normal         Spine:                  Previously seen  Heart:                 Appears normal         Upper Extremities:      Previously seen                         (4CH, axis, and                         situs)  RVOT:                  Appears normal         Lower Extremities:      Previously seen  Other:  Female gender previously seen. Nasal bone visualized. VC, 3VV and          3VTV visualized. ---------------------------------------------------------------------- Cervix Uterus Adnexa  Cervix  Length:           3.49  cm.  Normal appearance by transabdominal scan.  Right Ovary  Within normal limits.  Left Ovary  Within normal limits. ---------------------------------------------------------------------- Impression  Follow up growth due to prior IUGR  Normal interval growth with measurements consistent with  dates  Good fetal movement and amniotic fluid volume  I discussed with Ms.Bouch the diagnosis of  polyhydramnios defined as the MVP > 8 or AFI >24 cm. We  discussed the common etiologies include idiopathic,  aneupolidy, gastrointestinal conditions, cranial facial defects,   and neuromuscular conditions. There were no markers of  aneuploidy or obvious causes of the previously mentioned  conditions. She has a normal early 1 hr GTT.Marland Kitchen She declined  genetic screening.When the AFI is > 30 or > 11.9  we  recommend weekly testing however, given mild  polyhydramnios is observed we recommend daily kick counts  and delivery at 39 weeks.  We have scheduled MS. Mclucas to return in 4 weeks. We  recommend repeat 1GTT at 28 weeks. ---------------------------------------------------------------------- Recommendations  Follow up growth in 4 weeks. ----------------------------------------------------------------------               Lin Landsman, MD Electronically Signed Final Report   06/28/2020 04:19 pm ----------------------------------------------------------------------   Assessment and Plan:  Pregnancy: [id number] at [redacted]w[redacted]d 1. Multigravida of advanced maternal age in second trimester   2. Elevated hemoglobin A1c   3. Encounter for supervision of low-risk pregnancy in second trimester   4. History of ectopic pregnancy   5. Intermittent palpitations  - Ambulatory referral to Cardiology  Preterm labor symptoms and general obstetric precautions including but not limited to vaginal bleeding, contractions, leaking of fluid and fetal movement were reviewed in detail with the patient. I discussed the assessment and treatment plan with the patient. The patient was provided an opportunity to ask questions and all were answered. The patient agreed with the plan and demonstrated an understanding of the instructions. The patient was advised to call back or seek an in-person office evaluation/go to [facility] at [facility] for any urgent or concerning symptoms. Please refer to After Visit Summary for other counseling recommendations.   Advised increased hydration for occasional Braxton-Hicks contractions.  I provided 10 minutes of face-to-face time during this  encounter.  Return in about 3 weeks (around 08/02/2020) for ROB, in person, 3rd trim labs, 2 hr GTT.  Future Appointments  Date Time Provider Department Center  07/26/2020  1:45 PM [facility] NURSE [facility]  07/26/2020  2:00 PM [facility]    Warden Fillers, MD Center for [employer], [facility]

## 2020-07-12 NOTE — Progress Notes (Signed)
Pt wants to discuss [place], concerned of heart rate. Pt can't locate BP Cuff, advised once find to please notate reading in My Chart.

## 2020-07-26 ENCOUNTER — Ambulatory Visit: Attending: Obstetrics and Gynecology

## 2020-07-26 ENCOUNTER — Other Ambulatory Visit: Payer: Self-pay

## 2020-07-26 ENCOUNTER — Ambulatory Visit: Admitting: *Deleted

## 2020-07-26 ENCOUNTER — Encounter: Payer: Self-pay | Admitting: *Deleted

## 2020-07-26 DIAGNOSIS — O409XX0 Polyhydramnios, unspecified trimester, not applicable or unspecified: Secondary | ICD-10-CM | POA: Insufficient documentation

## 2020-07-26 DIAGNOSIS — R7309 Other abnormal glucose: Secondary | ICD-10-CM | POA: Diagnosis present

## 2020-07-26 DIAGNOSIS — O2692 Pregnancy related conditions, unspecified, second trimester: Secondary | ICD-10-CM | POA: Diagnosis not present

## 2020-07-26 DIAGNOSIS — Z8759 Personal history of other complications of pregnancy, childbirth and the puerperium: Secondary | ICD-10-CM

## 2020-07-26 DIAGNOSIS — O09292 Supervision of pregnancy with other poor reproductive or obstetric history, second trimester: Secondary | ICD-10-CM | POA: Diagnosis not present

## 2020-07-26 DIAGNOSIS — O09522 Supervision of elderly multigravida, second trimester: Secondary | ICD-10-CM | POA: Diagnosis not present

## 2020-07-26 DIAGNOSIS — Z362 Encounter for other antenatal screening follow-up: Secondary | ICD-10-CM

## 2020-07-26 DIAGNOSIS — Z3A27 27 weeks gestation of pregnancy: Secondary | ICD-10-CM

## 2020-07-26 DIAGNOSIS — O402XX0 Polyhydramnios, second trimester, not applicable or unspecified: Secondary | ICD-10-CM

## 2020-07-29 ENCOUNTER — Other Ambulatory Visit: Payer: Self-pay | Admitting: *Deleted

## 2020-07-29 DIAGNOSIS — O409XX0 Polyhydramnios, unspecified trimester, not applicable or unspecified: Secondary | ICD-10-CM

## 2020-07-31 ENCOUNTER — Other Ambulatory Visit: Payer: Self-pay | Admitting: *Deleted

## 2020-07-31 DIAGNOSIS — Z3490 Encounter for supervision of normal pregnancy, unspecified, unspecified trimester: Secondary | ICD-10-CM

## 2020-08-02 ENCOUNTER — Other Ambulatory Visit: Payer: Self-pay | Admitting: Obstetrics and Gynecology

## 2020-08-02 ENCOUNTER — Ambulatory Visit (INDEPENDENT_AMBULATORY_CARE_PROVIDER_SITE_OTHER): Admitting: Obstetrics and Gynecology

## 2020-08-02 ENCOUNTER — Other Ambulatory Visit

## 2020-08-02 ENCOUNTER — Other Ambulatory Visit: Payer: Self-pay

## 2020-08-02 VITALS — BP 124/78 | HR 81 | Wt 186.3 lb

## 2020-08-02 DIAGNOSIS — R7309 Other abnormal glucose: Secondary | ICD-10-CM

## 2020-08-02 DIAGNOSIS — R002 Palpitations: Secondary | ICD-10-CM

## 2020-08-02 DIAGNOSIS — O09523 Supervision of elderly multigravida, third trimester: Secondary | ICD-10-CM

## 2020-08-02 DIAGNOSIS — Z3493 Encounter for supervision of normal pregnancy, unspecified, third trimester: Secondary | ICD-10-CM

## 2020-08-02 DIAGNOSIS — J45909 Unspecified asthma, uncomplicated: Secondary | ICD-10-CM | POA: Insufficient documentation

## 2020-08-02 DIAGNOSIS — Z23 Encounter for immunization: Secondary | ICD-10-CM

## 2020-08-02 DIAGNOSIS — Z3490 Encounter for supervision of normal pregnancy, unspecified, unspecified trimester: Secondary | ICD-10-CM

## 2020-08-02 DIAGNOSIS — Z3A28 28 weeks gestation of pregnancy: Secondary | ICD-10-CM

## 2020-08-02 NOTE — Progress Notes (Signed)
   PRENATAL VISIT NOTE  Subjective:  Mary Duran is a 35 y.o. O1L5726 at [redacted]w[redacted]d being seen today for ongoing prenatal care.  She is currently monitored for the following issues for this low-risk pregnancy and has Supervision of low-risk pregnancy; History of ectopic pregnancy; AMA (advanced maternal age) multigravida 35+; Elevated hemoglobin A1c; Intermittent palpitations; Asthma; and [redacted] weeks gestation of pregnancy on their problem list.  Patient doing well with no acute concerns today. She reports no complaints.  Contractions: Irritability. Vag. Bleeding: None.  Movement: Present. Denies leaking of fluid.   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  Pt ok to travel as long as there are no signs of preterm labor and there are frequent breaks in travel to decrease risk of DVT.  The following portions of the patient's history were reviewed and updated as appropriate: allergies, current medications, past family history, past medical history, past social history, past surgical history and problem list. Problem list updated.  Objective:   Vitals:   08/02/20 0827  BP: 124/78  Pulse: 81  Weight: 186 lb 4.8 oz (84.5 kg)    Fetal Status: Fetal Heart Rate (bpm): 140   Movement: Present     General:  Alert, oriented and cooperative. Patient is in no acute distress.  Skin: Skin is warm and dry. No rash noted.   Cardiovascular: Normal heart rate noted  Respiratory: Normal respiratory effort, no problems with respiration noted  Abdomen: Soft, gravid, appropriate for gestational age.  Pain/Pressure: Present     Pelvic: Cervical exam deferred        Extremities: Normal range of motion.  Edema: None  Mental Status:  Normal mood and affect. Normal behavior. Normal judgment and thought content.   Assessment and Plan:  Pregnancy: [id number] at [redacted]w[redacted]d  1. Intermittent palpitations No sx  2. Elevated hemoglobin A1c 2 hour GTT today  3. Multigravida of advanced maternal age in  third trimester Continue MFM growth scans and testing  4. Encounter for supervision of low-risk pregnancy in third trimester  - Tdap vaccine greater than or equal to 7yo IM  5. [redacted] weeks gestation of pregnancy   Preterm labor symptoms and general obstetric precautions including but not limited to vaginal bleeding, contractions, leaking of fluid and fetal movement were reviewed in detail with the patient.  Please refer to After Visit Summary for other counseling recommendations.   Return in about 2 weeks (around 08/16/2020) for ROB, in person.   Mariel Aloe, MD Faculty Attending Center for [facility]

## 2020-08-02 NOTE — Patient Instructions (Addendum)
Third Trimester of Pregnancy  The third trimester of pregnancy is from week 28 through week 40. This is also called months 7 through 9. This trimester is when your unborn baby (fetus) is growing very fast. At the end of the ninth month, the unborn baby is about 20 inches long. It weighs about 6-10 pounds. Body changes during your third trimester Your body continues to go through many changes during this time. The changes vary and generally return to normal after the baby is born. Physical changes  Your weight will continue to increase. You may gain 25-35 pounds (11-16 kg) by the end of the pregnancy. If you are underweight, you may gain 28-40 lb (about 13-18 kg). If you are overweight, you may gain 15-25 lb (about 7-11 kg).  You may start to get stretch marks on your hips, belly (abdomen), and breasts.  Your breasts will continue to grow and may hurt. A yellow fluid (colostrum) may leak from your breasts. This is the first milk you are making for your baby.  You may have changes in your hair.  Your belly button may stick out.  You may have more swelling in your hands, face, or ankles. Health changes  You may have heartburn.  You may have trouble pooping (constipation).  You may get hemorrhoids. These are swollen veins in the butt that can itch or get painful.  You may have swollen veins (varicose veins) in your legs.  You may have more body aches in the pelvis, back, or thighs.  You may have more tingling or numbness in your hands, arms, and legs. The skin on your belly may also feel numb.  You may feel short of breath as your womb (uterus) gets bigger. Other changes  You may pee (urinate) more often.  You may have more problems sleeping.  You may notice the unborn baby "dropping," or moving lower in your belly.  You may have more discharge coming from your vagina.  Your joints may feel loose, and you may have pain around your pelvic bone. Follow these instructions at  home: Medicines  Take over-the-counter and prescription medicines only as told by your doctor. Some medicines are not safe during pregnancy.  Take a prenatal vitamin that contains at least 600 micrograms (mcg) of folic acid. Eating and drinking  Eat healthy meals that include: ? Fresh fruits and vegetables. ? Whole grains. ? Good sources of protein, such as meat, eggs, or tofu. ? Low-fat dairy products.  Avoid raw meat and unpasteurized juice, milk, and cheese. These carry germs that can harm you and your baby.  Eat 4 or 5 small meals rather than 3 large meals a day.  You may need to take these actions to prevent or treat trouble pooping: ? Drink enough fluids to keep your pee (urine) pale yellow. ? Eat foods that are high in fiber. These include beans, whole grains, and fresh fruits and vegetables. ? Limit foods that are high in fat and sugar. These include fried or sweet foods. Activity  Exercise only as told by your doctor. Stop exercising if you start to have cramps in your womb.  Avoid heavy lifting.  Do not exercise if it is too hot or too humid, or if you are in a place of great height (high altitude).  If you choose to, you may have sex unless your doctor tells you not to. Relieving pain and discomfort  Take breaks often, and rest with your legs raised (elevated) if you have   leg cramps or low back pain.  Take warm water baths (sitz baths) to soothe pain or discomfort caused by hemorrhoids. Use hemorrhoid cream if your doctor approves.  Wear a good support bra if your breasts are tender.  If you develop bulging, swollen veins in your legs: ? Wear support hose as told by your doctor. ? Raise your feet for 15 minutes, 3-4 times a day. ? Limit salt in your food. Safety  Talk to your doctor before traveling far distances.  Do not use hot tubs, steam rooms, or saunas.  Wear your seat belt at all times when you are in a car.  Talk with your doctor if someone is  hurting you or yelling at you a lot. Preparing for your baby's arrival To prepare for the arrival of your baby:  Take prenatal classes.  Visit the hospital and tour the maternity area.  Buy a rear-facing car seat. Learn how to install it in your car.  Prepare the baby's room. Take out all pillows and stuffed animals from the baby's crib. General instructions  Avoid cat litter boxes and soil used by cats. These carry germs that can cause harm to the baby and can cause a loss of your baby by miscarriage or stillbirth.  Do not douche or use tampons. Do not use scented sanitary pads.  Do not smoke or use any products that contain nicotine or tobacco. If you need help quitting, ask your doctor.  Do not drink alcohol.  Do not use herbal medicines, illegal drugs, or medicines that were not approved by your doctor. Chemicals in these products can affect your baby.  Keep all follow-up visits. This is important. Where to find more information  American Pregnancy Association: americanpregnancy.org  American College of Obstetricians and Gynecologists: www.acog.org  Office on Women's Health: womenshealth.gov/pregnancy Contact a doctor if:  You have a fever.  You have mild cramps or pressure in your lower belly.  You have a nagging pain in your belly area.  You vomit, or you have watery poop (diarrhea).  You have bad-smelling fluid coming from your vagina.  You have pain when you pee, or your pee smells bad.  You have a headache that does not go away when you take medicine.  You have changes in how you see, or you see spots in front of your eyes. Get help right away if:  Your water breaks.  You have regular contractions that are less than 5 minutes apart.  You are spotting or bleeding from your vagina.  You have very bad belly cramps or pain.  You have trouble breathing.  You have chest pain.  You faint.  You have not felt the baby move for the amount of time told by  your doctor.  You have new or increased pain, swelling, or redness in an arm or leg. Summary  The third trimester is from week 28 through week 40 (months 7 through 9). This is the time when your unborn baby is growing very fast.  During this time, your discomfort may increase as you gain weight and as your baby grows.  Get ready for your baby to arrive by taking prenatal classes, buying a rear-facing car seat, and preparing the baby's room.  Get help right away if you are bleeding from your vagina, you have chest pain and trouble breathing, or you have not felt the baby move for the amount of time told by your doctor. This information is not intended to replace advice given   to you by your health care provider. Make sure you discuss any questions you have with your health care provider. Document Revised: 11/15/2019 Document Reviewed: 09/21/2019 Elsevier Patient Education  2021 [employer]   Oral Glucose Tolerance Test During Pregnancy Why am I having this test? The oral glucose tolerance test (OGTT) is done to check how your body processes blood sugar (glucose). This is one of several tests used to diagnose diabetes that develops during pregnancy (gestational diabetes mellitus). Gestational diabetes is a short-term form of diabetes that some women develop while they are pregnant. It usually occurs during the second trimester of pregnancy and goes away after delivery. Testing, or screening, for gestational diabetes usually occurs at weeks 24-28 of pregnancy. You may have the OGTT test after having a 1-hour glucose screening test if the results from that test indicate that you may have gestational diabetes. This test may also be needed if:  You have a history of gestational diabetes.  There is a history of giving birth to very large babies or of losing pregnancies (having stillbirths).  You have signs and symptoms of diabetes, such as: ? Changes in your eyesight. ? Tingling or numbness in  your hands or feet. ? Changes in hunger, thirst, and urination, and these are not explained by your pregnancy. What is being tested? This test measures the amount of glucose in your blood at different times during a period of 3 hours. This shows how well your body can process glucose. What kind of sample is taken? Blood samples are required for this test. They are usually collected by inserting a needle into a blood vessel.   How do I prepare for this test?  For 3 days before your test, eat normally. Have plenty of carbohydrate-rich foods.  Follow instructions from your health care provider about: ? Eating or drinking restrictions on the day of the test. You may be asked not to eat or drink anything other than water (to fast) starting 8-10 hours before the test. ? Changing or stopping your regular medicines. Some medicines may interfere with this test. Tell a health care provider about:  All medicines you are taking, including vitamins, herbs, eye drops, creams, and over-the-counter medicines.  Any blood disorders you have.  Any surgeries you have had.  Any medical conditions you have. What happens during the test? First, your blood glucose will be measured. This is referred to as your fasting blood glucose because you fasted before the test. Then, you will drink a glucose solution that contains a certain amount of glucose. Your blood glucose will be measured again 1, 2, and 3 hours after you drink the solution. This test takes about 3 hours to complete. You will need to stay at the testing location during this time. During the testing period:  Do not eat or drink anything other than the glucose solution.  Do not exercise.  Do not use any products that contain nicotine or tobacco, such as cigarettes, e-cigarettes, and chewing tobacco. These can affect your test results. If you need help quitting, ask your health care provider. The testing procedure may vary among health care providers  and hospitals. How are the results reported? Your results will be reported as milligrams of glucose per deciliter of blood (mg/dL) or millimoles per liter (mmol/L). There is more than one source for screening and diagnosis reference values used to diagnose gestational diabetes. Your health care provider will compare your results to normal values that were established after testing a large   of people (reference values). Reference values may vary among labs and hospitals. For this test (Carpenter-Coustan), reference values are:  Fasting: 95 mg/dL (5.3 mmol/L).  1 hour: 180 mg/dL (29.9 mmol/L).  2 hour: 155 mg/dL (8.6 mmol/L).  3 hour: 140 mg/dL (7.8 mmol/L). What do the results mean? Results below the reference values are considered normal. If two or more of your blood glucose levels are at or above the reference values, you may be diagnosed with gestational diabetes. If only one level is high, your health care provider may suggest repeat testing or other tests to confirm a diagnosis. Talk with your health care provider about what your results mean. Questions to ask your health care provider Ask your health care provider, or the department that is doing the test:  When will my results be ready?  How will I get my results?  What are my treatment options?  What other tests do I need?  What are my next steps? Summary  The oral glucose tolerance test (OGTT) is one of several tests used to diagnose diabetes that develops during pregnancy (gestational diabetes mellitus). Gestational diabetes is a short-term form of diabetes that some women develop while they are pregnant.  You may have the OGTT test after having a 1-hour glucose screening test if the results from that test show that you may have gestational diabetes. You may also have this test if you have any symptoms or risk factors for this type of diabetes.  Talk with your health care provider about what your results mean. This  information is not intended to replace advice given to you by your health care provider. Make sure you discuss any questions you have with your health care provider. Document Revised: 11/16/2019 Document Reviewed: 11/16/2019 Elsevier Patient Education  2021 [employer].

## 2020-08-03 LAB — GLUCOSE TOLERANCE, 2 HOURS W/ 1HR
Glucose, 1 hour: 135 mg/dL (ref 65–179)
Glucose, 2 hour: 116 mg/dL (ref 65–152)
Glucose, Fasting: 78 mg/dL (ref 65–91)

## 2020-08-07 LAB — SYPHILIS: RPR W/REFLEX TO RPR TITER AND TREPONEMAL ANTIBODIES, TRADITIONAL SCREENING AND DIAGNOSIS ALGORITHM: RPR Ser Ql: NONREACTIVE

## 2020-08-07 LAB — CBC
Hematocrit: 35.1 % (ref 34.0–46.6)
Hemoglobin: 11.9 g/dL (ref 11.1–15.9)
MCH: 24.8 pg — ABNORMAL LOW (ref 26.6–33.0)
MCHC: 33.9 g/dL (ref 31.5–35.7)
MCV: 73 fL — ABNORMAL LOW (ref 79–97)
Platelets: 222 10*3/uL (ref 150–450)
RBC: 4.79 x10E6/uL (ref 3.77–5.28)
RDW: 12.5 % (ref 11.7–15.4)
WBC: 8.5 10*3/uL (ref 3.4–10.8)

## 2020-08-07 LAB — HIV ANTIBODY (ROUTINE TESTING W REFLEX): HIV Screen 4th Generation wRfx: NONREACTIVE

## 2020-08-09 ENCOUNTER — Ambulatory Visit: Admitting: Cardiology

## 2020-08-16 ENCOUNTER — Ambulatory Visit (INDEPENDENT_AMBULATORY_CARE_PROVIDER_SITE_OTHER): Admitting: Obstetrics and Gynecology

## 2020-08-16 ENCOUNTER — Encounter: Admitting: Obstetrics and Gynecology

## 2020-08-16 ENCOUNTER — Other Ambulatory Visit: Payer: Self-pay

## 2020-08-16 VITALS — BP 116/81 | HR 96 | Wt 186.6 lb

## 2020-08-16 DIAGNOSIS — Z3A30 30 weeks gestation of pregnancy: Secondary | ICD-10-CM

## 2020-08-16 DIAGNOSIS — O09523 Supervision of elderly multigravida, third trimester: Secondary | ICD-10-CM

## 2020-08-16 DIAGNOSIS — D563 Thalassemia minor: Secondary | ICD-10-CM

## 2020-08-16 DIAGNOSIS — O409XX0 Polyhydramnios, unspecified trimester, not applicable or unspecified: Secondary | ICD-10-CM

## 2020-08-16 DIAGNOSIS — O0990 Supervision of high risk pregnancy, unspecified, unspecified trimester: Secondary | ICD-10-CM

## 2020-08-16 NOTE — Progress Notes (Signed)
Prenatal Visit Note Date: 08/16/2020 Clinic: [facility]  Subjective:  Mary Duran is a 35 y.o. [phone] at [redacted]w[redacted]d being seen today for ongoing prenatal care.  She is currently monitored for the following issues for this high-risk pregnancy and has Supervision of high risk pregnancy, antepartum; History of ectopic pregnancy; AMA (advanced maternal age) multigravida 35+; Prediabetes; Intermittent palpitations; Asthma; and Polyhydramnios affecting pregnancy on their problem list.  Patient reports no complaints.   Contractions: Irritability. Vag. Bleeding: None.  Movement: Present. Denies leaking of fluid.   The following portions of the patient's history were reviewed and updated as appropriate: allergies, current medications, past family history, past medical history, past social history, past surgical history and problem list. Problem list updated.  Objective:   Vitals:   08/16/20 1002  BP: 116/81  Pulse: 96  Weight: 186 lb 9.6 oz (84.6 kg)    Fetal Status: Fetal Heart Rate (bpm): 153   Movement: Present     General:  Alert, oriented and cooperative. Patient is in no acute distress.  Skin: Skin is warm and dry. No rash noted.   Cardiovascular: Normal heart rate noted  Respiratory: Normal respiratory effort, no problems with respiration noted  Abdomen: Soft, gravid, appropriate for gestational age. Pain/Pressure: Present     Pelvic:  Cervical exam deferred        Extremities: Normal range of motion.  Edema: None  Mental Status: Normal mood and affect. Normal behavior. Normal judgment and thought content.   Urinalysis:      Assessment and Plan:  Pregnancy: [id number] at [redacted]w[redacted]d  1. [redacted] weeks gestation of pregnancy  2. Supervision of high risk pregnancy, antepartum Normal 28wk labs  3. Multigravida of advanced maternal age in third trimester See below  4. Polyhydramnios affecting pregnancy Patient wondering about implications, etc of the polyhydramnios;  pt previously d/w mfm at last u/s: 2/4 84%, afi 25.5, MVP 10.64.  I told her most common reason is unknown but most common are ruled out. She had a negative 2h GTT, which rules out GDM and she has had normal anatomy u/s to rule out structural abnormalities except for brachycephaly. I told her I would recommend cell free fetal dna to rule out down's syndrome given the soft markers on ultrasound.  Pt wondering if her being alpha thal trait would cause the poly and I told her that only in very rare circumstances and usually it's associated with s/s of fetal anemia like severe poly, heart issues, etc on ultrasound.   Pt would like to talk about the cffdna with her husband more, as he is against it.    Preterm labor symptoms and general obstetric precautions including but not limited to vaginal bleeding, contractions, leaking of fluid and fetal movement were reviewed in detail with the patient. Please refer to After Visit Summary for other counseling recommendations.  Return in about 2 weeks (around 08/30/2020) for in person, md visit.   [place] Bing, MD

## 2020-08-19 DIAGNOSIS — D563 Thalassemia minor: Secondary | ICD-10-CM | POA: Insufficient documentation

## 2020-08-23 ENCOUNTER — Encounter: Payer: Self-pay | Admitting: *Deleted

## 2020-08-23 ENCOUNTER — Other Ambulatory Visit: Payer: Self-pay

## 2020-08-23 ENCOUNTER — Ambulatory Visit: Attending: Obstetrics and Gynecology

## 2020-08-23 ENCOUNTER — Ambulatory Visit: Admitting: *Deleted

## 2020-08-23 DIAGNOSIS — O403XX0 Polyhydramnios, third trimester, not applicable or unspecified: Secondary | ICD-10-CM | POA: Diagnosis not present

## 2020-08-23 DIAGNOSIS — O0990 Supervision of high risk pregnancy, unspecified, unspecified trimester: Secondary | ICD-10-CM

## 2020-08-23 DIAGNOSIS — Z3A31 31 weeks gestation of pregnancy: Secondary | ICD-10-CM

## 2020-08-23 DIAGNOSIS — O09523 Supervision of elderly multigravida, third trimester: Secondary | ICD-10-CM | POA: Insufficient documentation

## 2020-08-23 DIAGNOSIS — O409XX0 Polyhydramnios, unspecified trimester, not applicable or unspecified: Secondary | ICD-10-CM | POA: Diagnosis present

## 2020-08-23 DIAGNOSIS — R7303 Prediabetes: Secondary | ICD-10-CM | POA: Insufficient documentation

## 2020-08-23 DIAGNOSIS — Z8759 Personal history of other complications of pregnancy, childbirth and the puerperium: Secondary | ICD-10-CM | POA: Insufficient documentation

## 2020-08-23 DIAGNOSIS — O09293 Supervision of pregnancy with other poor reproductive or obstetric history, third trimester: Secondary | ICD-10-CM

## 2020-08-30 ENCOUNTER — Encounter: Payer: Self-pay | Admitting: Obstetrics and Gynecology

## 2020-08-30 ENCOUNTER — Ambulatory Visit (INDEPENDENT_AMBULATORY_CARE_PROVIDER_SITE_OTHER): Payer: No Typology Code available for payment source | Admitting: Obstetrics and Gynecology

## 2020-08-30 ENCOUNTER — Other Ambulatory Visit: Payer: Self-pay

## 2020-08-30 VITALS — BP 128/79 | HR 92 | Wt 186.2 lb

## 2020-08-30 DIAGNOSIS — O09523 Supervision of elderly multigravida, third trimester: Secondary | ICD-10-CM

## 2020-08-30 DIAGNOSIS — Z3A32 32 weeks gestation of pregnancy: Secondary | ICD-10-CM

## 2020-08-30 DIAGNOSIS — O409XX0 Polyhydramnios, unspecified trimester, not applicable or unspecified: Secondary | ICD-10-CM

## 2020-08-30 DIAGNOSIS — O0990 Supervision of high risk pregnancy, unspecified, unspecified trimester: Secondary | ICD-10-CM

## 2020-08-30 NOTE — Progress Notes (Signed)
Prenatal Visit Note Date: 08/30/2020 Clinic: [facility]  Subjective:  Mary Duran is a 35 y.o. [phone] at [redacted]w[redacted]d being seen today for ongoing prenatal care.  She is currently monitored for the following issues for this high-risk pregnancy and has Supervision of high risk pregnancy, antepartum; History of ectopic pregnancy; AMA (advanced maternal age) multigravida 35+; Prediabetes; Intermittent palpitations; Asthma; Polyhydramnios affecting pregnancy; and Alpha thalassemia trait on their problem list.  Patient reports no complaints.   Contractions: Irritability. Vag. Bleeding: None.  Movement: Present. Denies leaking of fluid.   The following portions of the patient's history were reviewed and updated as appropriate: allergies, current medications, past family history, past medical history, past social history, past surgical history and problem list. Problem list updated.  Objective:   Vitals:   08/30/20 0947  BP: 128/79  Pulse: 92  Weight: 186 lb 3.2 oz (84.5 kg)    Fetal Status: Fetal Heart Rate (bpm): 159 Fundal Height: 33 cm Movement: Present     General:  Alert, oriented and cooperative. Patient is in no acute distress.  Skin: Skin is warm and dry. No rash noted.   Cardiovascular: Normal heart rate noted  Respiratory: Normal respiratory effort, no problems with respiration noted  Abdomen: Soft, gravid, appropriate for gestational age. Pain/Pressure: Present     Pelvic:  Cervical exam deferred        Extremities: Normal range of motion.  Edema: None  Mental Status: Normal mood and affect. Normal behavior. Normal judgment and thought content.   Urinalysis:      Assessment and Plan:  Pregnancy: [id number] at [redacted]w[redacted]d  1. Multigravida of advanced maternal age in third trimester - [place] MFM OB FOLLOW UP; Future  2. Polyhydramnios affecting pregnancy F/u rpt u/s one month 3/4: mvp 8.91, afi 21, 61%, 1899gm, ac 78% Pt declined cffdna  3. Supervision of high  risk pregnancy, antepartum  Preterm labor symptoms and general obstetric precautions including but not limited to vaginal bleeding, contractions, leaking of fluid and fetal movement were reviewed in detail with the patient. Please refer to After Visit Summary for other counseling recommendations.  Return in about 2 weeks (around 09/13/2020).   [place] Bing, MD

## 2020-08-30 NOTE — Progress Notes (Signed)
Clear to white vaginal discharge  Growth U/S scheduled on 10/07/20 @ 1115

## 2020-09-13 ENCOUNTER — Encounter: Payer: Self-pay | Admitting: Medical

## 2020-09-13 ENCOUNTER — Other Ambulatory Visit: Payer: Self-pay

## 2020-09-13 ENCOUNTER — Ambulatory Visit (INDEPENDENT_AMBULATORY_CARE_PROVIDER_SITE_OTHER): Payer: No Typology Code available for payment source | Admitting: Medical

## 2020-09-13 VITALS — BP 117/87 | HR 78 | Wt 188.7 lb

## 2020-09-13 DIAGNOSIS — D563 Thalassemia minor: Secondary | ICD-10-CM

## 2020-09-13 DIAGNOSIS — Z3A34 34 weeks gestation of pregnancy: Secondary | ICD-10-CM

## 2020-09-13 DIAGNOSIS — O09523 Supervision of elderly multigravida, third trimester: Secondary | ICD-10-CM

## 2020-09-13 DIAGNOSIS — O409XX0 Polyhydramnios, unspecified trimester, not applicable or unspecified: Secondary | ICD-10-CM

## 2020-09-13 DIAGNOSIS — O0990 Supervision of high risk pregnancy, unspecified, unspecified trimester: Secondary | ICD-10-CM

## 2020-09-13 DIAGNOSIS — J4520 Mild intermittent asthma, uncomplicated: Secondary | ICD-10-CM

## 2020-09-13 NOTE — Patient Instructions (Addendum)
Fetal Movement Counts Patient Name: ________________________________________________ Patient Due Date: ____________________  What is a fetal movement count? A fetal movement count is the number of times that you feel your baby move during a certain amount of time. This may also be called a fetal kick count. A fetal movement count is recommended for every pregnant woman. You may be asked to start counting fetal movements as early as week 28 of your pregnancy. Pay attention to when your baby is most active. You may notice your baby's sleep and wake cycles. You may also notice things that make your baby move more. You should do a fetal movement count:  When your baby is normally most active.  At the same time each day. A good time to count movements is while you are resting, after having something to eat and drink. How do I count fetal movements? 1. Find a quiet, comfortable area. Sit, or lie down on your side. 2. Write down the date, the start time and stop time, and the number of movements that you felt between those two times. Take this information with you to your health care visits. 3. Write down your start time when you feel the first movement. 4. Count kicks, flutters, swishes, rolls, and jabs. You should feel at least 10 movements. 5. You may stop counting after you have felt 10 movements, or if you have been counting for 2 hours. Write down the stop time. 6. If you do not feel 10 movements in 2 hours, contact your health care provider for further instructions. Your health care provider may want to do additional tests to assess your baby's well-being. Contact a health care provider if:  You feel fewer than 10 movements in 2 hours.  Your baby is not moving like he or she usually does. Date: ____________ Start time: ____________ Stop time: ____________ Movements: ____________ Date: ____________ Start time: ____________ Stop time: ____________ Movements: ____________ Date: ____________  Start time: ____________ Stop time: ____________ Movements: ____________ Date: ____________ Start time: ____________ Stop time: ____________ Movements: ____________ Date: ____________ Start time: ____________ Stop time: ____________ Movements: ____________ Date: ____________ Start time: ____________ Stop time: ____________ Movements: ____________ Date: ____________ Start time: ____________ Stop time: ____________ Movements: ____________ Date: ____________ Start time: ____________ Stop time: ____________ Movements: ____________ Date: ____________ Start time: ____________ Stop time: ____________ Movements: ____________ This information is not intended to replace advice given to you by your health care provider. Make sure you discuss any questions you have with your health care provider. Document Revised: 01/26/2019 Document Reviewed: 01/26/2019 Elsevier Patient Education  2021 Elsevier Inc. Rosen's Emergency Medicine: Concepts and Clinical Practice (9th ed., pp. 2296- 2312). Elsevier.">  Braxton Hicks Contractions Contractions of the uterus can occur throughout pregnancy, but they are not always a sign that you are in labor. You may have practice contractions called Braxton Hicks contractions. These false labor contractions are sometimes confused with true labor. What are Braxton Hicks contractions? Braxton Hicks contractions are tightening movements that occur in the muscles of the uterus before labor. Unlike true labor contractions, these contractions do not result in opening (dilation) and thinning of the cervix. Toward the end of pregnancy (32-34 weeks), Braxton Hicks contractions can happen more often and may become stronger. These contractions are sometimes difficult to tell apart from true labor because they can be very uncomfortable. You should not feel embarrassed if you go to the hospital with false labor. Sometimes, the only way to tell if you are in true labor is for your   health care  provider to look for changes in the cervix. The health care provider will do a physical exam and may monitor your contractions. If you are not in true labor, the exam should show that your cervix is not dilating and your water has not broken. If there are no other health problems associated with your pregnancy, it is completely safe for you to be sent home with false labor. You may continue to have Braxton Hicks contractions until you go into true labor. How to tell the difference between true labor and false labor True labor  Contractions last 30-70 seconds.  Contractions become very regular.  Discomfort is usually felt in the top of the uterus, and it spreads to the lower abdomen and low back.  Contractions do not go away with walking.  Contractions usually become more intense and increase in frequency.  The cervix dilates and gets thinner. False labor  Contractions are usually shorter and not as strong as true labor contractions.  Contractions are usually irregular.  Contractions are often felt in the front of the lower abdomen and in the groin.  Contractions may go away when you walk around or change positions while lying down.  Contractions get weaker and are shorter-lasting as time goes on.  The cervix usually does not dilate or become thin. Follow these instructions at home:  Take over-the-counter and prescription medicines only as told by your health care provider.  Keep up with your usual exercises and follow other instructions from your health care provider.  Eat and drink lightly if you think you are going into labor.  If Braxton Hicks contractions are making you uncomfortable: ? Change your position from lying down or resting to walking, or change from walking to resting. ? Sit and rest in a tub of warm water. ? Drink enough fluid to keep your urine pale yellow. Dehydration may cause these contractions. ? Do slow and deep breathing several times an hour.  Keep  all follow-up prenatal visits as told by your health care provider. This is important.   Contact a health care provider if:  You have a fever.  You have continuous pain in your abdomen. Get help right away if:  Your contractions become stronger, more regular, and closer together.  You have fluid leaking or gushing from your vagina.  You pass blood-tinged mucus (bloody show).  You have bleeding from your vagina.  You have low back pain that you never had before.  You feel your baby's head pushing down and causing pelvic pressure.  Your baby is not moving inside you as much as it used to. Summary  Contractions that occur before labor are called Braxton Hicks contractions, false labor, or practice contractions.  Braxton Hicks contractions are usually shorter, weaker, farther apart, and less regular than true labor contractions. True labor contractions usually become progressively stronger and regular, and they become more frequent.  Manage discomfort from [facility] contractions by changing position, resting in a warm bath, drinking plenty of water, or practicing deep breathing. This information is not intended to replace advice given to you by your health care provider. Make sure you discuss any questions you have with your health care provider. Document Revised: 05/21/2017 Document Reviewed: 10/22/2016 Elsevier Patient Education  2021 [employer].   Pre-registration and birth certificate:  [email or web address]

## 2020-09-13 NOTE — Progress Notes (Signed)
Patient complains of round ligament pain when she moves

## 2020-09-13 NOTE — Progress Notes (Signed)
   PRENATAL VISIT NOTE  Subjective:  Mary Duran is a 35 y.o. [id number] at [redacted]w[redacted]d being seen today for ongoing prenatal care.  She is currently monitored for the following issues for this high-risk pregnancy and has Supervision of high risk pregnancy, antepartum; History of ectopic pregnancy; AMA (advanced maternal age) multigravida 35+; Prediabetes; Intermittent palpitations; Asthma; Polyhydramnios affecting pregnancy; and Alpha thalassemia trait on their problem list.  Patient reports backache.  Contractions: Irritability. Vag. Bleeding: None.  Movement: Present. Denies leaking of fluid.   The following portions of the patient's history were reviewed and updated as appropriate: allergies, current medications, past family history, past medical history, past social history, past surgical history and problem list.   Objective:   Vitals:   09/13/20 0831  BP: 117/87  Pulse: 78  Weight: 188 lb 11.2 oz (85.6 kg)    Fetal Status: Fetal Heart Rate (bpm): 132 Fundal Height: 35 cm Movement: Present     General:  Alert, oriented and cooperative. Patient is in no acute distress.  Skin: Skin is warm and dry. No rash noted.   Cardiovascular: Normal heart rate noted  Respiratory: Normal respiratory effort, no problems with respiration noted  Abdomen: Soft, gravid, appropriate for gestational age.  Pain/Pressure: Present     Pelvic: Cervical exam deferred        Extremities: Normal range of motion.  Edema: None  Mental Status: Normal mood and affect. Normal behavior. Normal judgment and thought content.   Assessment and Plan:  Pregnancy: [id number] at [redacted]w[redacted]d 1. Supervision of high risk pregnancy, antepartum - Doing well - Anticipatory guidance for next visits including GBS and GC/CT at next visit discussed   2. Multigravida of advanced maternal age in third trimester - F/U Growth [place] scheduled 4/18  3. Polyhydramnios affecting pregnancy - Normal AFI on 3/4, will continue to monitor  4. Alpha  thalassemia trait  5. Mild intermittent asthma without complication  6. [redacted] weeks gestation of pregnancy   Preterm labor symptoms and general obstetric precautions including but not limited to vaginal bleeding, contractions, leaking of fluid and fetal movement were reviewed in detail with the patient. Please refer to After Visit Summary for other counseling recommendations.   Return in about 2 weeks (around 09/27/2020) for LOB, In-Person.  Future Appointments  Date Time Provider Department Center  09/27/2020  8:15 AM [place] Bing, MD [facility]  10/07/2020  9:15 AM [place] Bing, MD [facility]  10/07/2020 11:15 AM [facility] NURSE [facility]  10/07/2020 11:30 AM [facility] US3 [facility]  10/15/2020  8:35 AM [place] Cantor, CNM [facility]    Vonzella Nipple, PA-C

## 2020-09-27 ENCOUNTER — Other Ambulatory Visit (HOSPITAL_COMMUNITY)
Admission: RE | Admit: 2020-09-27 | Discharge: 2020-09-27 | Disposition: A | Discharge status: Home / Self Care | Source: Ambulatory Visit | Attending: Obstetrics and Gynecology | Admitting: Obstetrics and Gynecology

## 2020-09-27 ENCOUNTER — Ambulatory Visit (INDEPENDENT_AMBULATORY_CARE_PROVIDER_SITE_OTHER): Payer: No Typology Code available for payment source | Admitting: Obstetrics and Gynecology

## 2020-09-27 ENCOUNTER — Other Ambulatory Visit: Payer: Self-pay

## 2020-09-27 VITALS — BP 128/97 | HR 76 | Wt 194.0 lb

## 2020-09-27 DIAGNOSIS — O133 Gestational [pregnancy-induced] hypertension without significant proteinuria, third trimester: Secondary | ICD-10-CM | POA: Diagnosis present

## 2020-09-27 DIAGNOSIS — O0990 Supervision of high risk pregnancy, unspecified, unspecified trimester: Secondary | ICD-10-CM | POA: Insufficient documentation

## 2020-09-27 DIAGNOSIS — O139 Gestational [pregnancy-induced] hypertension without significant proteinuria, unspecified trimester: Secondary | ICD-10-CM | POA: Insufficient documentation

## 2020-09-27 DIAGNOSIS — O09523 Supervision of elderly multigravida, third trimester: Secondary | ICD-10-CM

## 2020-09-27 DIAGNOSIS — O409XX0 Polyhydramnios, unspecified trimester, not applicable or unspecified: Secondary | ICD-10-CM

## 2020-09-27 LAB — COMPREHENSIVE METABOLIC PANEL
ALT: 11 IU/L (ref 0–32)
AST: 21 IU/L (ref 0–40)
Albumin/Globulin Ratio: 1.3 (ref 1.2–2.2)
Albumin: 3.5 g/dL — ABNORMAL LOW (ref 3.8–4.8)
Alkaline Phosphatase: 131 IU/L — ABNORMAL HIGH (ref 44–121)
BUN/Creatinine Ratio: 7 — ABNORMAL LOW (ref 9–23)
BUN: 5 mg/dL — ABNORMAL LOW (ref 6–20)
Bilirubin Total: <0.2 mg/dL (ref 0.0–1.2)
CO2: 17 mmol/L — ABNORMAL LOW (ref 20–29)
Calcium: 9.2 mg/dL (ref 8.7–10.2)
Chloride: 103 mmol/L (ref 96–106)
Creatinine, Ser: 0.73 mg/dL (ref 0.57–1.00)
Globulin, Total: 2.6 g/dL (ref 1.5–4.5)
Glucose: 134 mg/dL — ABNORMAL HIGH (ref 65–99)
Potassium: 3.9 mmol/L (ref 3.5–5.2)
Sodium: 136 mmol/L (ref 134–144)
Total Protein: 6.1 g/dL (ref 6.0–8.5)
eGFR: 110 mL/min/{1.73_m2} (ref >59)

## 2020-09-27 LAB — CBC
Hematocrit: 34.5 % (ref 34.0–46.6)
Hemoglobin: 11.6 g/dL (ref 11.1–15.9)
MCH: 24.5 pg — ABNORMAL LOW (ref 26.6–33.0)
MCHC: 33.6 g/dL (ref 31.5–35.7)
MCV: 73 fL — ABNORMAL LOW (ref 79–97)
Platelets: 162 10*3/uL (ref 150–450)
RBC: 4.73 x10E6/uL (ref 3.77–5.28)
RDW: 13.6 % (ref 11.7–15.4)
WBC: 8.9 10*3/uL (ref 3.4–10.8)

## 2020-09-27 NOTE — Patient Instructions (Signed)
Top number in the 150s and/or the bottom number in the 100s, come to the hospital.   Hypertension During Pregnancy Hypertension is also called high blood pressure. High blood pressure means that the force of the blood moving in your body is high enough to cause problems for you and your baby. Different types of high blood pressure can happen during pregnancy. The types are:  High blood pressure before you got pregnant. This is called chronic hypertension.  This can continue during your pregnancy. Your doctor will want to keep checking your blood pressure. You may need medicine to control your blood pressure while you are pregnant. You will need follow-up visits after you have your baby.  High blood pressure that goes up during pregnancy when it was normal before. This is called gestational hypertension. It will often get better after you have your baby, but your doctor will need to watch your blood pressure to make sure that it is getting better.  You may develop high blood pressure after giving birth. This is called postpartum hypertension. This often occurs within 48 hours after childbirth but may occur up to 6 weeks after giving birth. Very high blood pressure during pregnancy is an emergency that needs treatment right away. How does this affect me? If you have high blood pressure during pregnancy, you have a higher chance of developing high blood pressure:  As you get older.  If you get pregnant again. In some cases, high blood pressure during pregnancy can cause:  Stroke.  Heart attack.  Damage to the kidneys, lungs, or liver.  Preeclampsia.  HELLP syndrome.  Seizures.  Problems with the placenta. How does this affect my baby? Your baby may:  Be born early.  Not weigh as much as he or she should.  Not handle labor well, leading to a C-section. This condition may also result in a baby's death before birth (stillbirth). What are the risks?  Having high blood pressure  during a past pregnancy.  Being overweight.  Being age 26 or older.  Being pregnant for the first time.  Being pregnant with more than one baby.  Becoming pregnant using fertility methods, such as IVF.  Having other problems, such as diabetes or kidney disease. What can I do to lower my risk?  Keep a healthy weight.  Eat a healthy diet.  Follow what your doctor tells you about treating any medical problems that you had before you got pregnant. It is very important to go to all of your doctor visits. Your doctor will check your blood pressure and make sure that your pregnancy is progressing as it should. Treatment should start early if a problem is found.   How is this treated? Treatment for high blood pressure during pregnancy can vary. It depends on the type of high blood pressure you have and how serious it is.  If you were taking medicine for your blood pressure before you got pregnant, talk with your doctor. You may need to change the medicine during pregnancy if it is not safe for your baby.  If your blood pressure goes up during pregnancy, your doctor may order medicine to treat this.  If you are at risk for preeclampsia, your doctor may tell you to take a low-dose aspirin while you are pregnant.  If you have very high blood pressure, you may need to stay in the hospital so you and your baby can be watched closely. You may also need to take medicine to lower your blood  pressure.  In some cases, if your condition gets worse, you may need to have your baby early. Follow these instructions at home: Eating and drinking  Drink enough fluid to keep your pee (urine) pale yellow.  Avoid caffeine.   Lifestyle  Do not smoke or use any products that contain nicotine or tobacco. If you need help quitting, ask your doctor.  Do not use alcohol or drugs.  Avoid stress.  Rest and get plenty of sleep.  Regular exercise can help. Ask your doctor what kinds of exercise are best  for you. General instructions  Take over-the-counter and prescription medicines only as told by your doctor.  Keep all prenatal and follow-up visits. Contact a doctor if:  You have symptoms that your doctor told you to watch for, such as: ? Headaches. ? A feeling like you may vomit (nausea). ? Vomiting. ? Belly (abdominal) pain. ? Feeling dizzy or light-headed. Get help right away if:  You have symptoms of serious problems, such as: ? Very bad belly pain that does not get better with treatment. ? A very bad headache that does not get better. ? Blurry vision. ? Double vision. ? Vomiting that does not get better. ? Sudden, fast weight gain. ? Sudden swelling in your hands, ankles, or face. ? Bleeding from your vagina. ? Blood in your pee. ? Shortness of breath. ? Chest pain. ? Weakness on one side of your body. ? Trouble talking.  Your baby is not moving as much as usual. These symptoms may be an emergency. Get help right away. Call your local emergency services (911 in the [place]  Do not wait to see if the symptoms will go away.  Do not drive yourself to the hospital. Summary  High blood pressure is also called hypertension.  High blood pressure means that the force of the blood moving in your body is high enough to cause problems for you and your baby.  Get help right away if you have symptoms of serious problems due to high blood pressure.  Keep all prenatal and follow-up visits. This information is not intended to replace advice given to you by your health care provider. Make sure you discuss any questions you have with your health care provider. Document Revised: 02/29/2020 Document Reviewed: 02/29/2020 Elsevier Patient Education  2021 [employer].

## 2020-09-27 NOTE — Progress Notes (Signed)
   PRENATAL VISIT NOTE  Subjective:  Mary Duran is a 35 y.o. [id number] at [redacted]w[redacted]d being seen today for ongoing prenatal care.  She is currently monitored for the following issues for this high-risk pregnancy and has Supervision of high risk pregnancy, antepartum; History of ectopic pregnancy; AMA (advanced maternal age) multigravida 35+; Prediabetes; Intermittent palpitations; Asthma; Polyhydramnios affecting pregnancy; Alpha thalassemia trait; and Transient hypertension of pregnancy in third trimester on their problem list.  Patient reports mildly elevated BPs at home. b/l LE edema.  Contractions: Irritability. Vag. Bleeding: None.  Movement: Present. Denies leaking of fluid.   The following portions of the patient's history were reviewed and updated as appropriate: allergies, current medications, past family history, past medical history, past social history, past surgical history and problem list.   Objective:   Vitals:   09/27/20 0824 09/27/20 0858  BP: (!) 143/89 (!) 128/97  Pulse: 76   Weight: 194 lb (88 kg)     Fetal Status: Fetal Heart Rate (bpm): 127   Movement: Present  Presentation: Vertex  General:  Alert, oriented and cooperative. Patient is in no acute distress.  Skin: Skin is warm and dry. No rash noted.   Cardiovascular: Normal heart rate noted  Respiratory: Normal respiratory effort, no problems with respiration noted  Abdomen: Soft, gravid, appropriate for gestational age.  Pain/Pressure: Absent     Pelvic: Cervical exam performed in the presence of a chaperone Dilation: 1.5 Effacement (%): 50 Station: -3  Extremities: Normal range of motion.  Edema: Trace  Mental Status: Normal mood and affect. Normal behavior. Normal judgment and thought content.  Normal s1 and s2, no mrgs ctab 1+brachial b/l Abdomen nttp Assessment and Plan:  Pregnancy: [id number] at [redacted]w[redacted]d 1. Supervision of high risk pregnancy, antepartum Routine care - Culture, beta strep (group b only) -  GC/Chlamydia probe amp ([place])not at [facility] - Protein / creatinine ratio, urine - CBC - Comprehensive metabolic panel  2. Transient hypertension of pregnancy in third trimester Pre-eclampisa precautions given. Will get labs today. bp check on Monday. I d/w her re: 37wk delivery if labs or subsequent clinic BPs rule her in - Culture, beta strep (group b only) - GC/Chlamydia probe amp ([place])not at [facility] - Protein / creatinine ratio, urine  3. Multigravida of advanced maternal age in third trimester No issues  4. Polyhydramnios affecting pregnancy Slight at last growth u/s. Has rpt for 4/18  Preterm labor symptoms and general obstetric precautions including but not limited to vaginal bleeding, contractions, leaking of fluid and fetal movement were reviewed in detail with the patient. Please refer to After Visit Summary for other counseling recommendations.   Return in about 3 days (around 09/30/2020) for in person, bp check.  Future Appointments  Date Time Provider Department Center  09/30/2020 10:40 AM [facility] NURSE [facility]  10/07/2020  9:15 AM [place] Bing, MD [facility]  10/07/2020 11:15 AM [facility]URSE [facility]  10/07/2020 11:30 AM [facility] US3 [facility]  10/15/2020  8:35 AM [place] Cantor, CNM [facility]    [place] Bing, MD

## 2020-09-28 LAB — PROTEIN / CREATININE RATIO, URINE
Creatinine, Urine: 76.8 mg/dL
Protein, Ur: 10.3 mg/dL
Protein/Creat Ratio: 134 mg/g creat (ref 0–200)

## 2020-09-30 ENCOUNTER — Encounter: Payer: Self-pay | Admitting: *Deleted

## 2020-09-30 ENCOUNTER — Other Ambulatory Visit: Payer: Self-pay

## 2020-09-30 ENCOUNTER — Encounter: Payer: Self-pay | Admitting: Lactation Services

## 2020-09-30 ENCOUNTER — Ambulatory Visit (INDEPENDENT_AMBULATORY_CARE_PROVIDER_SITE_OTHER): Payer: No Typology Code available for payment source | Admitting: Lactation Services

## 2020-09-30 ENCOUNTER — Other Ambulatory Visit: Payer: Self-pay | Admitting: Family Medicine

## 2020-09-30 DIAGNOSIS — Z0130 Encounter for examination of blood pressure without abnormal findings: Secondary | ICD-10-CM

## 2020-09-30 LAB — GC/CHLAMYDIA PROBE AMP (~~LOC~~) NOT AT ARMC
Chlamydia: NEGATIVE
Comment: NEGATIVE
Comment: NORMAL
Neisseria Gonorrhea: NEGATIVE

## 2020-09-30 NOTE — Progress Notes (Signed)
Notified by RN that patient had presenting for BP check after elevated pressures at office visit three days prior, again with mild range BP's and asymptomatic, rules in for gestational HTN. Labs were normal three days prior.  Instructed to schedule midnight IOL for tomorrow. Orders placed.

## 2020-09-30 NOTE — Progress Notes (Signed)
Patient here for BP check. She reports she is feeling ok. She is having intermittent contractions. Patient reports no visual changes, no headache, no dizziness. No rib pain, some nausea.   Patient with swelling to feet and ankles. No pitting noted. She has mild swelling to hands.   BP 141/96 HR 61. Repeat BP after 15 minutes 141/95 HR 68  Infant less active than he was per mom. HR 144 today.   Spoke with Dr. Crissie Reese and he reports patient needs to be scheduled for induction. Induction scheduled for tonight at 11:45 pm. Patient informed that she can eat before arriving. Patient voiced understanding.

## 2020-10-01 ENCOUNTER — Encounter (HOSPITAL_COMMUNITY): Payer: Self-pay | Admitting: Family Medicine

## 2020-10-01 ENCOUNTER — Other Ambulatory Visit: Payer: Self-pay

## 2020-10-01 ENCOUNTER — Inpatient Hospital Stay (HOSPITAL_COMMUNITY)
Admission: AD | Admit: 2020-10-01 | Discharge: 2020-10-03 | DRG: 807 | Disposition: A | Discharge status: Home / Self Care | Attending: Family Medicine | Admitting: Family Medicine

## 2020-10-01 ENCOUNTER — Inpatient Hospital Stay (HOSPITAL_COMMUNITY)

## 2020-10-01 DIAGNOSIS — Z20822 Contact with and (suspected) exposure to covid-19: Secondary | ICD-10-CM | POA: Diagnosis present

## 2020-10-01 DIAGNOSIS — O139 Gestational [pregnancy-induced] hypertension without significant proteinuria, unspecified trimester: Secondary | ICD-10-CM | POA: Diagnosis present

## 2020-10-01 DIAGNOSIS — J45909 Unspecified asthma, uncomplicated: Secondary | ICD-10-CM | POA: Diagnosis present

## 2020-10-01 DIAGNOSIS — O134 Gestational [pregnancy-induced] hypertension without significant proteinuria, complicating childbirth: Secondary | ICD-10-CM | POA: Diagnosis not present

## 2020-10-01 DIAGNOSIS — D563 Thalassemia minor: Secondary | ICD-10-CM | POA: Diagnosis present

## 2020-10-01 DIAGNOSIS — Z3A37 37 weeks gestation of pregnancy: Secondary | ICD-10-CM

## 2020-10-01 DIAGNOSIS — O9952 Diseases of the respiratory system complicating childbirth: Secondary | ICD-10-CM | POA: Diagnosis present

## 2020-10-01 DIAGNOSIS — O409XX0 Polyhydramnios, unspecified trimester, not applicable or unspecified: Secondary | ICD-10-CM | POA: Diagnosis present

## 2020-10-01 DIAGNOSIS — R002 Palpitations: Secondary | ICD-10-CM | POA: Diagnosis present

## 2020-10-01 DIAGNOSIS — Z8759 Personal history of other complications of pregnancy, childbirth and the puerperium: Secondary | ICD-10-CM

## 2020-10-01 DIAGNOSIS — R7303 Prediabetes: Secondary | ICD-10-CM | POA: Diagnosis present

## 2020-10-01 DIAGNOSIS — O09529 Supervision of elderly multigravida, unspecified trimester: Secondary | ICD-10-CM

## 2020-10-01 DIAGNOSIS — O0990 Supervision of high risk pregnancy, unspecified, unspecified trimester: Secondary | ICD-10-CM

## 2020-10-01 HISTORY — DX: Gestational (pregnancy-induced) hypertension without significant proteinuria, unspecified trimester: O13.9

## 2020-10-01 LAB — COMPREHENSIVE METABOLIC PANEL
ALT: 14 U/L (ref 0–44)
AST: 23 U/L (ref 15–41)
Albumin: 2.8 g/dL — ABNORMAL LOW (ref 3.5–5.0)
Alkaline Phosphatase: 119 U/L (ref 38–126)
Anion gap: 5 (ref 5–15)
BUN: 6 mg/dL (ref 6–20)
CO2: 22 mmol/L (ref 22–32)
Calcium: 9 mg/dL (ref 8.9–10.3)
Chloride: 106 mmol/L (ref 98–111)
Creatinine, Ser: 0.84 mg/dL (ref 0.44–1.00)
GFR, Estimated: >60 mL/min (ref >60)
Glucose, Bld: 109 mg/dL — ABNORMAL HIGH (ref 70–99)
Potassium: 3.7 mmol/L (ref 3.5–5.1)
Sodium: 133 mmol/L — ABNORMAL LOW (ref 135–145)
Total Bilirubin: 0.2 mg/dL — ABNORMAL LOW (ref 0.3–1.2)
Total Protein: 6.3 g/dL — ABNORMAL LOW (ref 6.5–8.1)

## 2020-10-01 LAB — PROTEIN / CREATININE RATIO, URINE
Creatinine, Urine: 101.15 mg/dL
Protein Creatinine Ratio: 0.13 mg/mg{Cre} (ref 0.00–0.15)
Total Protein, Urine: 13 mg/dL

## 2020-10-01 LAB — CBC
HCT: 32 % — ABNORMAL LOW (ref 36.0–46.0)
HCT: 35.4 % — ABNORMAL LOW (ref 36.0–46.0)
Hemoglobin: 10.6 g/dL — ABNORMAL LOW (ref 12.0–15.0)
Hemoglobin: 11.7 g/dL — ABNORMAL LOW (ref 12.0–15.0)
MCH: 24.4 pg — ABNORMAL LOW (ref 26.0–34.0)
MCH: 24.7 pg — ABNORMAL LOW (ref 26.0–34.0)
MCHC: 33.1 g/dL (ref 30.0–36.0)
MCHC: 33.1 g/dL (ref 30.0–36.0)
MCV: 73.6 fL — ABNORMAL LOW (ref 80.0–100.0)
MCV: 74.8 fL — ABNORMAL LOW (ref 80.0–100.0)
Platelets: 139 10*3/uL — ABNORMAL LOW (ref 150–400)
Platelets: 150 10*3/uL (ref 150–400)
RBC: 4.35 MIL/uL (ref 3.87–5.11)
RBC: 4.73 MIL/uL (ref 3.87–5.11)
RDW: 13.8 % (ref 11.5–15.5)
RDW: 14.1 % (ref 11.5–15.5)
WBC: 8 10*3/uL (ref 4.0–10.5)
WBC: 11.1 10*3/uL — ABNORMAL HIGH (ref 4.0–10.5)
nRBC: 0 % (ref 0.0–0.2)
nRBC: 0 % (ref 0.0–0.2)

## 2020-10-01 LAB — RPR: RPR Ser Ql: NONREACTIVE

## 2020-10-01 LAB — TYPE AND SCREEN
ABO/RH(D): A POS
Antibody Screen: NEGATIVE

## 2020-10-01 LAB — RESP PANEL BY RT-PCR (FLU A&B, COVID) ARPGX2
Influenza A by PCR: NEGATIVE
Influenza B by PCR: NEGATIVE
SARS Coronavirus 2 by RT PCR: NEGATIVE

## 2020-10-01 LAB — CULTURE, BETA STREP (GROUP B ONLY): Strep Gp B Culture: NEGATIVE

## 2020-10-01 MED ORDER — DIPHENHYDRAMINE HCL 25 MG PO CAPS: 25.0000 mg | ORAL_CAPSULE | Freq: Four times a day (QID) | ORAL | Status: DC | PRN

## 2020-10-01 MED ORDER — LACTATED RINGERS IV SOLN: INTRAVENOUS | Status: DC

## 2020-10-01 MED ORDER — EPHEDRINE 5 MG/ML INJ: 10.0000 mg | INTRAVENOUS | Status: DC | PRN

## 2020-10-01 MED ORDER — DIPHENHYDRAMINE HCL 50 MG/ML IJ SOLN: 12.5000 mg | INTRAMUSCULAR | Status: DC | PRN

## 2020-10-01 MED ORDER — DIBUCAINE (PERIANAL) 1 % EX OINT: 1.0000 "application " | TOPICAL_OINTMENT | CUTANEOUS | Status: DC | PRN

## 2020-10-01 MED ORDER — LACTATED RINGERS IV SOLN: 500.0000 mL | INTRAVENOUS | Status: DC | PRN

## 2020-10-01 MED ORDER — OXYTOCIN-SODIUM CHLORIDE 30-0.9 UT/500ML-% IV SOLN
1.0000 m[IU]/min | INTRAVENOUS | Status: DC
  Administered 2020-10-01: 2 m[IU]/min via INTRAVENOUS

## 2020-10-01 MED ORDER — IBUPROFEN 800 MG PO TABS
800.0000 mg | ORAL_TABLET | Freq: Three times a day (TID) | ORAL | Status: DC
  Administered 2020-10-01 – 2020-10-03 (×6): 800 mg via ORAL
  Filled 2020-10-01 (×6): qty 1

## 2020-10-01 MED ORDER — FENTANYL-BUPIVACAINE-NACL 0.5-0.125-0.9 MG/250ML-% EP SOLN: 12.0000 mL/h | EPIDURAL | Status: DC | PRN

## 2020-10-01 MED ORDER — WITCH HAZEL-GLYCERIN EX PADS: 1.0000 "application " | MEDICATED_PAD | CUTANEOUS | Status: DC | PRN

## 2020-10-01 MED ORDER — ONDANSETRON HCL 4 MG/2ML IJ SOLN: 4.0000 mg | Freq: Four times a day (QID) | INTRAMUSCULAR | Status: DC | PRN

## 2020-10-01 MED ORDER — ONDANSETRON HCL 4 MG PO TABS: 4.0000 mg | ORAL_TABLET | ORAL | Status: DC | PRN

## 2020-10-01 MED ORDER — PHENYLEPHRINE 40 MCG/ML (10ML) SYRINGE FOR IV PUSH (FOR BLOOD PRESSURE SUPPORT): 80.0000 ug | PREFILLED_SYRINGE | INTRAVENOUS | Status: DC | PRN

## 2020-10-01 MED ORDER — ACETAMINOPHEN 325 MG PO TABS: 650.0000 mg | ORAL_TABLET | ORAL | Status: DC | PRN

## 2020-10-01 MED ORDER — MEASLES, MUMPS & RUBELLA VAC IJ SOLR: 0.5000 mL | Freq: Once | INTRAMUSCULAR | Status: DC

## 2020-10-01 MED ORDER — TERBUTALINE SULFATE 1 MG/ML IJ SOLN: 0.2500 mg | Freq: Once | INTRAMUSCULAR | Status: DC | PRN

## 2020-10-01 MED ORDER — DOCUSATE SODIUM 100 MG PO CAPS
100.0000 mg | ORAL_CAPSULE | Freq: Two times a day (BID) | ORAL | Status: DC
  Administered 2020-10-01 – 2020-10-03 (×3): 100 mg via ORAL
  Filled 2020-10-01 (×3): qty 1

## 2020-10-01 MED ORDER — BENZOCAINE-MENTHOL 20-0.5 % EX AERO
1.0000 "application " | INHALATION_SPRAY | CUTANEOUS | Status: DC | PRN
  Administered 2020-10-01: 1 via TOPICAL
  Filled 2020-10-01: qty 56

## 2020-10-01 MED ORDER — ACETAMINOPHEN 500 MG PO TABS
1000.0000 mg | ORAL_TABLET | Freq: Three times a day (TID) | ORAL | Status: DC
  Administered 2020-10-01 – 2020-10-03 (×6): 1000 mg via ORAL
  Filled 2020-10-01 (×6): qty 2

## 2020-10-01 MED ORDER — LACTATED RINGERS IV SOLN: 500.0000 mL | Freq: Once | INTRAVENOUS | Status: DC

## 2020-10-01 MED ORDER — COCONUT OIL OIL: 1.0000 "application " | TOPICAL_OIL | Status: DC | PRN

## 2020-10-01 MED ORDER — OXYTOCIN-SODIUM CHLORIDE 30-0.9 UT/500ML-% IV SOLN
2.5000 [IU]/h | INTRAVENOUS | Status: DC
  Administered 2020-10-01: 2.5 [IU]/h via INTRAVENOUS
  Filled 2020-10-01 (×2): qty 500

## 2020-10-01 MED ORDER — SOD CITRATE-CITRIC ACID 500-334 MG/5ML PO SOLN: 30.0000 mL | ORAL | Status: DC | PRN

## 2020-10-01 MED ORDER — LIDOCAINE HCL (PF) 1 % IJ SOLN
30.0000 mL | INTRAMUSCULAR | Status: AC | PRN
  Administered 2020-10-01: 30 mL via SUBCUTANEOUS
  Filled 2020-10-01: qty 30

## 2020-10-01 MED ORDER — TETANUS-DIPHTH-ACELL PERTUSSIS 5-2.5-18.5 LF-MCG/0.5 IM SUSY: 0.5000 mL | PREFILLED_SYRINGE | Freq: Once | INTRAMUSCULAR | Status: DC

## 2020-10-01 MED ORDER — SIMETHICONE 80 MG PO CHEW: 80.0000 mg | CHEWABLE_TABLET | ORAL | Status: DC | PRN

## 2020-10-01 MED ORDER — ONDANSETRON HCL 4 MG/2ML IJ SOLN: 4.0000 mg | INTRAMUSCULAR | Status: DC | PRN

## 2020-10-01 MED ORDER — FENTANYL CITRATE (PF) 100 MCG/2ML IJ SOLN
50.0000 ug | INTRAMUSCULAR | Status: DC | PRN
Start: 2020-10-01 — End: 2020-10-01

## 2020-10-01 MED ORDER — OXYTOCIN BOLUS FROM INFUSION
333.0000 mL | Freq: Once | INTRAVENOUS | Status: AC
  Administered 2020-10-01: 333 mL via INTRAVENOUS

## 2020-10-01 MED ORDER — SENNOSIDES-DOCUSATE SODIUM 8.6-50 MG PO TABS
2.0000 | ORAL_TABLET | ORAL | Status: DC
  Administered 2020-10-02 – 2020-10-03 (×2): 2 via ORAL
  Filled 2020-10-01 (×2): qty 2

## 2020-10-01 MED ORDER — PRENATAL MULTIVITAMIN CH
1.0000 | ORAL_TABLET | Freq: Every day | ORAL | Status: DC
  Administered 2020-10-02 – 2020-10-03 (×2): 1 via ORAL
  Filled 2020-10-01 (×2): qty 1

## 2020-10-01 NOTE — Progress Notes (Signed)
Mary Duran is a 35 y.o. [id number] at [redacted]w[redacted]d by LMP admitted for induction of labor due to Hypertension.  Subjective: Patient doing well, barely feeling contractions. On pitocin. Comfortable. Discussed AROM to augment induction, patient amenable. Reviewed r/b/a, patient verbally consented.  Objective: BP 123/90   Pulse 63   Temp 97.8 F (36.6 C) (Oral)   Resp 16   Ht 5\' 3"  (1.6 m)   Wt 87.1 kg   LMP 01/16/2020   BMI 34.01 kg/m  No intake/output data recorded. No intake/output data recorded.  FHT:  FHR: 135 bpm, variability: moderate,  accelerations:  Present,  decelerations:  Absent UC:   regular, every 5 minutes SVE:   Dilation: 4 Effacement (%): 50 Station: -2 Exam by:: Dr.Firestone  Reviewed R/B/A of AROM to augment labor. Patient verbally consented. AROM performed with large amount of clear fluid return. Head well applied to cervix. Patient and baby tolerated procedure well.    Labs: Lab Results  Component Value Date   WBC 8.0 10/01/2020   HGB 10.6 (L) 10/01/2020   HCT 32.0 (L) 10/01/2020   MCV 73.6 (L) 10/01/2020   PLT 139 (L) 10/01/2020    Assessment / Plan: Induction of labor due to gestational hypertension,  progressing well on pitocin  Labor: On pitocin without any cervical change (pit at 14), AROM performed with clear fluid at 11:15am.  Preeclampsia:  no signs or symptoms of toxicity and negative workup for PreE/ Fetal Wellbeing:  Category I Pain Control:  Labor support without medications I/D:  GBS Neg Anticipated MOD:  NSVD  12/01/2020, DO 10/01/2020, 11:16 AM

## 2020-10-01 NOTE — H&P (Signed)
OBSTETRIC ADMISSION HISTORY AND PHYSICAL  Mary Duran is a 35 y.o. female [phone] with IUP at [redacted]w[redacted]d by LMP presenting for IOL-gHTN. She reports +FMs, No LOF, no VB, no blurry vision, headaches or peripheral edema, and RUQ pain.  She plans on breast feeding. She request outpatient IUD for birth control. She received her prenatal care at [facility]   Dating: By LMP --->  Estimated Date of Delivery: 10/22/20  Sono:    08/23/20@[redacted]w[redacted]d , CWD, normal anatomy, cephalic presentation, anterior placental lie, 1899g, 61% EFW   Prenatal History/Complications:  gHTN Pre-diabetes (early a1c 6.1, passed 2hr gtt) AMA Intermittent palpitations (no meds, unk etiology) Seasonal Asthma (no history of hospitalizations/intubations, no meds) Polyhydramnios (resolved)   Past Medical History: Past Medical History:  Diagnosis Date  . Anxiety     Past Surgical History: Past Surgical History:  Procedure Laterality Date  . CERVICAL POLYPECTOMY  2018  . LASIK    . WISDOM TOOTH EXTRACTION      Obstetrical History: OB History    Gravida  4   Para  2   Term  2   Preterm  0   AB  1   Living  2     SAB      IAB      Ectopic  1   Multiple      Live Births  2        Obstetric Comments  Daughter in [place], son in [place]        Social History Social History   Socioeconomic History  . Marital status: Married    Spouse name: Not on file  . Number of children: Not on file  . Years of education: Not on file  . Highest education level: Not on file  Occupational History  . Not on file  Tobacco Use  . Smoking status: Never Smoker  . Smokeless tobacco: Never Used  Vaping Use  . Vaping Use: Never used  Substance and Sexual Activity  . Alcohol use: Not Currently    Comment: rare  . Drug use: Never  . Sexual activity: Yes    Birth control/protection: None  Other Topics Concern  . Not on file  Social History Narrative  . Not on file   Social Determinants of Health    Financial Resource Strain: Not on file  Food Insecurity: No Food Insecurity  . Worried About Programme researcher, broadcasting/film/video in the Last Year: Never true  . Ran Out of Food in the Last Year: Never true  Transportation Needs: No Transportation Needs  . Lack of Transportation (Medical): No  . Lack of Transportation (Non-Medical): No  Physical Activity: Not on file  Stress: Not on file  Social Connections: Not on file    Family History: Family History  Problem Relation Age of Onset  . Hypertension Mother   . Hypertension Father     Allergies: Allergies  Allergen Reactions  . Latex Rash  . Sulfa Antibiotics Rash    Medications Prior to Admission  Medication Sig Dispense Refill Last Dose  . calcium carbonate (TUMS - DOSED IN MG ELEMENTAL CALCIUM) 500 MG chewable tablet Chew 1 tablet by mouth daily.     Marland Kitchen loratadine (CLARITIN) 10 MG tablet Take 10 mg by mouth daily. (Patient not taking: No sig reported)     . montelukast (SINGULAIR) 10 MG tablet montelukast 10 mg tablet  Take 1 tablet every day by oral route. (Patient not taking: No sig reported)     .  Polyethyl Glycol-Propyl Glycol (SYSTANE OP) Apply 1 drop to eye as needed.     . Prenatal Vit-Fe Fumarate-FA (PRENATAL VITAMIN PO) Take 1 tablet by mouth daily.     Marland Kitchen PROAIR HFA 108 (90 Base) MCG/ACT inhaler 1-2 puffs every 4 (four) hours as needed.  (Patient not taking: No sig reported)        Review of Systems   All systems reviewed and negative except as stated in HPI  Last menstrual period 01/16/2020, unknown if currently breastfeeding. General appearance: alert, cooperative and no distress Lungs: normal respiratory effort Heart: regular rate and rhythm Abdomen: soft, non-tender; gravid Pelvic: as noted below Extremities: Homans sign is negative, no sign of DVT Presentation: cephalic by cervical exam Fetal monitoringBaseline: 130 bpm, Variability: Good {> 6 bpm), Accelerations: Reactive and Decelerations: Absent Uterine  activityFrequency: Every 1-7 minutes     Prenatal labs: ABO, Rh: A/Positive/-- (10/14 3491) Antibody: Negative (10/14 0934) Rubella: 7.24 (10/14 0934) RPR: Non Reactive (02/11 0000)  HBsAg: Negative (10/14 0934)  HIV: Non Reactive (02/11 0000)  GBS:   pending, PCR pending 2 hr Glucola passed Genetic screening  declined Anatomy US normal  Prenatal Transfer Tool  Maternal Diabetes: Yes:  Diabetes Type:  Pre-pregnancy (pre-diabetes) Genetic Screening: Declined Maternal Ultrasounds/Referrals: Normal Fetal Ultrasounds or other Referrals:  None Maternal Substance Abuse:  No Significant Maternal Medications:  None Significant Maternal Lab Results: Other: GBS unk  No results found for this or any previous visit (from the past 24 hour(s)).  Patient Active Problem List   Diagnosis Date Noted  . Transient hypertension of pregnancy in third trimester 09/27/2020  . Alpha thalassemia trait 08/19/2020  . Polyhydramnios affecting pregnancy 08/16/2020  . Asthma 08/02/2020  . Intermittent palpitations 07/12/2020  . AMA (advanced maternal age) multigravida 35+ 03/29/2020  . Prediabetes 03/29/2020  . Supervision of high risk pregnancy, antepartum 03/14/2020  . History of ectopic pregnancy 03/14/2020    Assessment/Plan:  Mary Duran Study is a 35 y.o. [id number] at [redacted]w[redacted]d here for IOL-gHTN.  #IOL: Discussed IOL process with patient. Given cervical exam will start pitocin and AROM when able. #Pain: PRN, patient would like to avoid epidural #FWB: Cat 1 #ID: GBS unk (culture collected 09/27/20, pending), no risk factors, will not treat. #MOF: breast #MOC: progesterone IUD, outpatient Event organiser insurance) #Circ: yes  #gHTN: asymptomatic. preE labs pending. Admit BP 135/90.   #Seasonal asthma: only 1 month per year, takes allergy meds with relief. Does not use albuterol often.   #Intermittent palpitations: unknown etiology, no meds. Currently HR 65. Asymptomatic.   Alric Seton, MD   10/01/2020, 12:15 AM

## 2020-10-01 NOTE — Progress Notes (Signed)
Labor Progress Note Mary Duran is a 35 y.o. [id number] at [redacted]w[redacted]d presented for IOL-gHTN. S: Doing well without complaints.  O:  BP 120/77   Pulse (!) 55   Temp 97.7 F (36.5 C) (Oral)   Resp 18   Ht 5\' 3"  (1.6 m)   Wt 87.1 kg   LMP 01/16/2020   BMI 34.01 kg/m  EFM: baseline 135bpm/mod variability/+ accels/no decels Toco: q5 min  CVE: Dilation: 4 Effacement (%): 50 Station: -2 Presentation: Vertex Exam by:: Dr.Rheanna Sergent   A&P: 35 y.o. 31 [redacted]w[redacted]d presented for IOL-gHTN. #IOL: Pit started @0100 , currently at 105mL/hr. Patient still ballotable so will defer AROM at this time. Continue to titrate. AROM when able. #Pain: PRN, prefers no epidural #FWB: cat 1 #GBS unk, culture pending from 4/8, will not treat given no risk factors #gHTN: preE labs nml, asymptomatic. BP stable. Continue to monitor.  9m, MD 6:07 AM

## 2020-10-01 NOTE — Lactation Note (Signed)
This note was copied from a baby's chart. Lactation Consultation Note  Patient Name: Mary Duran [id number] Date: 10/01/2020 Reason for consult: L&D Initial assessment Age:35 hours   P3, Mother breastfed her first for one yr and her second for 3 month. Her children are 8 and 9 yrs. Infant is rooting toward her breast. She has him in cradle hold. Infant latches on for a few seconds and then lets go. Mother has erect nipples and compressible tissue. Infant after multiple attempts latches on for 5 mins on and off.  Mother taught to hand express.  Suggest that she continue to try or just do STS. Discussed feeding cue and cue base feeding . Advised mother in the second 24 hour period that infant would feed 8-12 or more times. Mother to be see later on this shift.   Maternal Data Has patient been taught Hand Expression?: Yes  Feeding Mother's Current Feeding Choice: Breast Milk  LATCH Score Latch: Repeated attempts needed to sustain latch, nipple held in mouth throughout feeding, stimulation needed to elicit sucking reflex.  Audible Swallowing: None  Type of Nipple: Everted at rest and after stimulation  Comfort (Breast/Nipple): Soft / non-tender  Hold (Positioning): Assistance needed to correctly position infant at breast and maintain latch.  LATCH Score: 6   Lactation Tools Discussed/Used    Interventions Interventions: Breast feeding basics reviewed;Assisted with latch;Skin to skin;Hand express;Adjust position;Support pillows;Position options  Discharge    Consult Status Consult Status: Follow-up Date: 10/01/20 Follow-up type: In-patient    Stevan Born [facility] 10/01/2020, 2:30 PM

## 2020-10-01 NOTE — Discharge Summary (Addendum)
Postpartum Discharge Summary     Patient Name: Mary Duran DOB: April 23, 1986 MRN: [id number]  Date of admission: 10/01/2020 Delivery date:10/01/2020  Delivering provider: Isaias Sakai  Date of discharge: 10/03/2020  Admitting diagnosis: Gestational hypertension [O13.9] Intrauterine pregnancy: [redacted]w[redacted]d    Secondary diagnosis:  Active Problems:   Supervision of high risk pregnancy, antepartum   History of ectopic pregnancy   AMA (advanced maternal age) multigravida 35+   Prediabetes   Intermittent palpitations   Asthma   Polyhydramnios affecting pregnancy   Alpha thalassemia trait   Gestational hypertension  Additional problems: None    Discharge diagnosis: Term Pregnancy Delivered and Gestational Hypertension                                              Post partum procedures:None Augmentation: AROM and Pitocin Complications: None  Hospital course: Induction of Labor With Vaginal Delivery   35y.o. yo [id number] admitted to the hospital 10/01/2020 for induction of labor.  Indication for induction: Gestational hypertension (neg labs; mild range elevations).  Patient had an uncomplicated labor course as follows: Membrane Rupture Time/Date: 11:06 AM ,10/01/2020   Delivery Method:Vaginal, Spontaneous  Episiotomy: None  Lacerations:  1st degree  Details of delivery can be found in separate delivery note.  Patient had a routine postpartum course with the addition of Procardia being ordered to start on PPD#1, however she declined the medication. By PPD#2 she was in agreement with being discharged on procardia [id number] instructions to follow up postpartum for BP check. Patient is discharged home 10/03/20.  Newborn Data: Birth date:10/01/2020  Birth time:1:35 PM  Gender:Female  Living status:Living  Apgars:9 ,9  Weight:2880 g   Magnesium Sulfate received: No BMZ received: No Rhophylac:N/A MMR:N/A T-DaP:Given prenatally Flu: Yes given  prenatally Transfusion:No  Physical exam  Vitals:   10/02/20 1025 10/02/20 1500 10/02/20 2126 10/03/20 0430  BP: 122/74 139/83 132/90 119/71  Pulse: 71  68 68  Resp:  '18 18 18  ' Temp:  98 F (36.7 C) 98.3 F (36.8 C) 98.3 F (36.8 C)  TempSrc:  Oral Oral Oral  SpO2:  98% 100% 100%  Weight:      Height:       General: alert, cooperative and no distress Lochia: appropriate Uterine Fundus: firm Incision: N/A DVT Evaluation: No evidence of DVT seen on physical exam. Labs: Lab Results  Component Value Date   WBC 11.1 (H) 10/01/2020   HGB 11.7 (L) 10/01/2020   HCT 35.4 (L) 10/01/2020   MCV 74.8 (L) 10/01/2020   PLT 150 10/01/2020   CMP Latest Ref Rng & Units 10/01/2020  Glucose 70 - 99 mg/dL 109(H)  BUN 6 - 20 mg/dL 6  Creatinine 0.44 - 1.00 mg/dL 0.84  Sodium 135 - 145 mmol/L 133(L)  Potassium 3.5 - 5.1 mmol/L 3.7  Chloride 98 - 111 mmol/L 106  CO2 22 - 32 mmol/L 22  Calcium 8.9 - 10.3 mg/dL 9.0  Total Protein 6.5 - 8.1 g/dL 6.3(L)  Total Bilirubin 0.3 - 1.2 mg/dL 0.2(L)  Alkaline Phos 38 - 126 U/L 119  AST 15 - 41 U/L 23  ALT 0 - 44 U/L 14   Edinburgh Score: Edinburgh Postnatal Depression Scale Screening Tool 10/02/2020  I have been able to laugh and see the funny side of things. 0  I have  looked forward with enjoyment to things. 0  I have blamed myself unnecessarily when things went wrong. 1  I have been anxious or worried for no good reason. 0  I have felt scared or panicky for no good reason. 0  Things have been getting on top of me. 0  I have been so unhappy that I have had difficulty sleeping. 0  I have felt sad or miserable. 0  I have been so unhappy that I have been crying. 0  The thought of harming myself has occurred to me. 0  Edinburgh Postnatal Depression Scale Total 1     After visit meds:  Allergies as of 10/03/2020      Reactions   Latex Rash   Sulfa Antibiotics Rash      Medication List    TAKE these medications   albuterol 108 (90 Base)  MCG/ACT inhaler Commonly known as: VENTOLIN HFA Inhale 1-2 puffs into the lungs every 6 (six) hours as needed for wheezing or shortness of breath.   calcium carbonate 500 MG chewable tablet Commonly known as: TUMS - dosed in mg elemental calcium Chew 2 tablets by mouth 2 (two) times daily as needed for heartburn.   diphenhydrAMINE 25 MG tablet Commonly known as: BENADRYL Take 25 mg by mouth 2 (two) times daily as needed for allergies.   NIFEdipine 30 MG 24 hr tablet Commonly known as: ADALAT CC Take 1 tablet (30 mg total) by mouth daily.   prenatal multivitamin Tabs tablet Take 1 tablet by mouth daily at 12 noon.   SYSTANE OP Place 1 drop into both eyes 2 (two) times daily as needed (For dryness.).        Discharge home in stable condition Infant Feeding: Breast Infant Disposition:home with mother Discharge instruction: per After Visit Summary and Postpartum booklet. Activity: Advance as tolerated. Pelvic rest for 6 weeks.  Diet: routine diet Future Appointments:No future appointments.   Follow up Visit:  Please schedule this patient for a In person postpartum visit in 4 weeks with the following provider: Any provider. Additional Postpartum F/U: f/u BP check one week  High risk pregnancy complicated by: Gestational hypertension  Delivery mode:  Vaginal, Spontaneous  Anticipated Birth Control:  IUD   10/03/2020 Delora Fuel, MD   CNM attestation I have seen and examined this patient and agree with above documentation in the resident's note.   Mary Duran is a 35 y.o. [phone] s/p vag del.   Pain is well controlled.  Plan for birth control is IUD.  Method of Feeding: breast  PE:  BP 119/71 (BP Location: Right Arm)   Pulse 68   Temp 98.3 F (36.8 C) (Oral)   Resp 18   Ht '5\' 3"'  (1.6 m)   Wt 87.1 kg   LMP 01/16/2020   SpO2 100%   Breastfeeding Unknown   BMI 34.01 kg/m  Fundus firm  Recent Labs    10/01/20 0033 10/01/20 1331  HGB 10.6* 11.7*  HCT  32.0* 35.4*     Plan: discharge today - postpartum care discussed - f/u clinic in 1 week for BP check, then 4 weeks for postpartum visit   Myrtis Ser, CNM 9:38 AM  10/03/2020

## 2020-10-02 ENCOUNTER — Other Ambulatory Visit (HOSPITAL_COMMUNITY): Payer: Self-pay

## 2020-10-02 MED ORDER — NIFEDIPINE ER 30 MG PO TB24
30.0000 mg | ORAL_TABLET | Freq: Every day | ORAL | 0 refills | Status: DC
  Filled 2020-10-02: qty 30, 30d supply, fill #0

## 2020-10-02 MED ORDER — NIFEDIPINE ER OSMOTIC RELEASE 30 MG PO TB24
30.0000 mg | ORAL_TABLET | Freq: Every day | ORAL | Status: DC
  Filled 2020-10-02: qty 1

## 2020-10-02 NOTE — Social Work (Signed)
CSW received consult for hx of Anxiety. CSW met with MOB to offer support and complete assessment.    CSW met with the patient at bedside. CSW introduced role and congratulated MOB. CSW observed the infant resting in the bassinet and MOB returning to bed. FOB was resting on the couch.  CSW offered MOB privacy. MOB prefers FOB stay. CSW explained the reason for the visit. MOB pleasant and receptive to [place] visit. CSW asked MOB how she has felt since giving birth. MOB reports, "I feel good."  CSW asked MOB about her history with anxiety. MOB reports she was diagnosed with anxiety in 2017-2018. MOB reports stress related due to covid and issues in the world. MOB reports she enrolled in therapy in 2020 at [facility]. MOB reports counseling was helpful because it taught her how to recognize her triggers and stop the intrusive thoughts. MOB reports no history of medication treatment. CSW provided education regarding the baby blues period vs. perinatal mood disorders, discussed treatment and gave additional resources for mental health follow up. MOB receptive to the resources. CSW assessed MOB for safety. MOB reports no thoughts of harm to self and others.   CSW recommended MOB complete a self-evaluation during the postpartum time period using the New Mom Checklist from Postpartum Progress and encouraged MOB to contact a medical professional if symptoms are noted at any time. MOB receptive to the check list. CSW asked MOB who are her supports. MOB reports the FOB, her sister-in-law that lives in the home and provides immediate support and her mom who travels from [place] to see her.   CSW provided review of Sudden Infant Death Syndrome (SIDS) precautions and informed MOB no-sleeping with the infant. CSW asked MOB about infant safe sleep space. MOB reports she has a pack in play and bassinet. CSW asked MOB if she has all essential items for the infant. MOB reports having all essential items for the infant.  CSW  assessed MOB for additional needs. MOB reports no further need identified.   CSW identifies no further need for intervention and no barriers to discharge at this time.  Kathrin Greathouse, MSW, LCSW Women's and [place] Worker  [phone] 10/02/2020  10:02 AM

## 2020-10-02 NOTE — Lactation Note (Signed)
This note was copied from a baby's chart. Lactation Consultation Note  Patient Name: Mary Duran IZTIW'P Date: 10/02/2020 Reason for consult: Follow-up assessment;Early term 37-38.6wks;Infant weight loss Age:35 hours  Visited with mom of 31 hours old ETI female, she's a P3 and experienced BF. Baby already nursing when entered the room but not doing STS. Asked mom's permission to do so and LC assisted with repositioning baby at the breast. Latch was deep, and baby started sucking rhythmically; mom voiced feeding at the breast are comfortable, she only has slight soreness in the beginning of the feeding, probably experiencing transient soreness.  Reviewed normal ETI behavior, feeding cues, cluster feeding, size of baby's stomach, lactogenesis II and pumping schedule, mom has been set up with a DEBP but she's only used it once today. LC assisted mom with hand expression on the other breast and colostrum was flowing so easily. Baby still nursing when exiting the room at the 10 minutes mark.  Feeding plan:  1. Encouraged mom to feed baby STS 8-12 times/24 hours or sooner if feeding cues are present 2. Pumping after feedings was also recommended 3. Parents will offer any amount of EBM mom may get; they're also aware we have donor milk in the unit in case they want to keep baby exclusively on a breastmilk diet  BF brochure, BF resources and feeding diary were reviewed. Mom is anticipating discharge tomorrow. FOB present at the time of [facility] consultation. Parents reported all questions and concerns were answered, they're both aware of LC OP services and will call PRN.   Maternal Data    Feeding Mother's Current Feeding Choice: Breast Milk  LATCH Score Latch: Grasps breast easily, tongue down, lips flanged, rhythmical sucking.  Audible Swallowing: A few with stimulation  Type of Nipple: Everted at rest and after stimulation  Comfort (Breast/Nipple): Soft / non-tender  Hold (Positioning):  No assistance needed to correctly position infant at breast.  LATCH Score: 9   Lactation Tools Discussed/Used Tools: Pump;Flanges Flange Size: 24 Breast pump type: Double-Electric Breast Pump Pump Education: Setup, frequency, and cleaning;Milk Storage Reason for Pumping: ETI Pumping frequency: q 3 hours or as needed Pumped volume: 4 mL  Interventions Interventions: Breast feeding basics reviewed;Skin to skin;Breast massage;Adjust position;Support pillows;Hand express;Assisted with latch  Discharge Pump: Personal (Medela DEBP at home)  Consult Status Consult Status: Follow-up Date: 10/03/20 Follow-up type: In-patient    Mary Duran Mary Duran 10/02/2020, 9:02 PM

## 2020-10-02 NOTE — Lactation Note (Addendum)
This note was copied from a baby's chart. Lactation Consultation Note Baby 16 hrs old. Attempted to see mom but everyone in rm sleeping. Baby had 5% wt. Loss. Has had 3 stools and 2 voids.   Patient Name: Meylin Stenzel Allbritton [id number] Date: 10/02/2020   Age:35 hours  Maternal Data    Feeding    LATCH Score Latch: Too sleepy or reluctant, no latch achieved, no sucking elicited.  Audible Swallowing: None  Type of Nipple: Everted at rest and after stimulation  Comfort (Breast/Nipple): Soft / non-tender  Hold (Positioning): Assistance needed to correctly position infant at breast and maintain latch.  LATCH Score: 5   Lactation Tools Discussed/Used    Interventions    Discharge    Consult Status      Charyl Dancer 10/02/2020, 5:57 AM

## 2020-10-02 NOTE — Discharge Instructions (Signed)
Please call if concern of blood pressures persistently over 140/90, severe headache, vision changes, or other concerns as noted below.  Postpartum Care After Vaginal Delivery The following information offers guidance about how to care for yourself from the time you deliver your baby to 6-12 weeks after delivery (postpartum period). If you have problems or questions, contact your health care provider for more specific instructions. Follow these instructions at home: Vaginal bleeding  It is normal to have vaginal bleeding (lochia) after delivery. Wear a sanitary pad for bleeding and discharge. ? During the first week after delivery, the amount and appearance of lochia is often similar to a menstrual period. ? Over the next few weeks, it will gradually decrease to a dry, yellow-brown discharge. ? For most women, lochia stops completely by 4-6 weeks after delivery, but can vary.  Change your sanitary pads frequently. Watch for any changes in your flow, such as: ? A sudden increase in volume. ? A change in color. ? Large blood clots.  If you pass a blood clot from your vagina, save it and call your health care provider. Do not flush blood clots down the toilet before talking with your health care provider.  Do not use tampons or douches until your health care provider approves.  If you are not breastfeeding, your period should return 6-8 weeks after delivery. If you are feeding your baby breast milk only, your period may not return until you stop breastfeeding. Perineal care  Keep the area between the vagina and the anus (perineum) clean and dry. Use medicated pads and pain-relieving sprays and creams as directed.  If you had a surgical cut in the perineum (episiotomy) or a tear, check the area for signs of infection until you are healed. Check for: ? More redness, swelling, or pain. ? Fluid or blood coming from the cut or tear. ? Warmth. ? Pus or a bad smell.  You may be given a squirt  bottle to use instead of wiping to clean the perineum area after you use the bathroom. Pat the area gently to dry it.  To relieve pain caused by an episiotomy, a tear, or swollen veins in the anus (hemorrhoids), take a warm sitz bath 2-3 times a day. In a sitz bath, the warm water should only come up to your hips and cover your buttocks.   Breast care  In the first few days after delivery, your breasts may feel heavy, full, and uncomfortable (breast engorgement). Milk may also leak from your breasts. Ask your health care provider about ways to help relieve the discomfort.  If you are breastfeeding: ? Wear a bra that supports your breasts and fits well. Use breast pads to absorb milk that leaks. ? Keep your nipples clean and dry. Apply creams and ointments as told. ? You may have uterine contractions every time you breastfeed for up to several weeks after delivery. This helps your uterus return to its normal size. ? If you have any problems with breastfeeding, notify your health care provider or lactation consultant.  If you are not breastfeeding: ? Avoid touching your breasts. Do not squeeze out (express) milk. Doing this can make your breasts produce more milk. ? Wear a good-fitting bra and use cold packs to help with swelling. Intimacy and sexuality  Ask your health care provider when you can engage in sexual activity. This may depend upon: ? Your risk of infection. ? How fast you are healing. ? Your comfort and desire to engage in  sexual activity.  You are able to get pregnant after delivery, even if you have not had your period. Talk with your health care provider about methods of birth control (contraception) or family planning if you desire future pregnancies. Medicines  Take over-the-counter and prescription medicines only as told by your health care provider.  Take an over-the-counter stool softener to help ease bowel movements as told by your health care provider.  If you were  prescribed an antibiotic medicine, take it as told by your health care provider. Do not stop taking the antibiotic even if you start to feel better.  Review all previous and current prescriptions to check for possible transfer into breast milk. Activity  Gradually return to your normal activities as told by your health care provider.  Rest as much as possible. Nap while your baby is sleeping. Eating and drinking  Drink enough fluid to keep your urine pale yellow.  To help prevent or relieve constipation, eat high-fiber foods every day.  Choose healthy eating to support breastfeeding or weight loss goals.  Take your prenatal vitamins until your health care provider tells you to stop.   General tips/recommendations  Do not use any products that contain nicotine or tobacco. These products include cigarettes, chewing tobacco, and vaping devices, such as e-cigarettes. If you need help quitting, ask your health care provider.  Do not drink alcohol, especially if you are breastfeeding.  Do not take medications or drugs that are not prescribed to you, especially if you are breastfeeding.  Visit your health care provider for a postpartum checkup within the first 3-6 weeks after delivery.  Complete a comprehensive postpartum visit no later than 12 weeks after delivery.  Keep all follow-up visits for you and your baby. Contact a health care provider if:  You feel unusually sad or worried.  Your breasts become red, painful, or hard.  You have a fever or other signs of an infection.  You have bleeding that is soaking through one pad an hour or you have blood clots.  You have a severe headache that doesn't go away or you have vision changes.  You have nausea and vomiting and are unable to eat or drink anything for 24 hours. Get help right away if:  You have chest pain or difficulty breathing.  You have sudden, severe leg pain.  You faint or have a seizure.  You have thoughts  about hurting yourself or your baby. If you ever feel like you may hurt yourself or others, or have thoughts about taking your own life, get help right away. Go to your nearest emergency department or:  Call your local emergency services (911 in the [place]  The National Suicide Prevention Lifeline at 606-607-6252. This suicide crisis helpline is open 24 hours a day.  Text the Crisis Text Line at 602-307-6237 (in the [place] Summary  The period of time after you deliver your newborn up to 6-12 weeks after delivery is called the postpartum period.  Keep all follow-up visits for you and your baby.  Review all previous and current prescriptions to check for possible transfer into breast milk.  Contact a health care provider if you feel unusually sad or worried during the postpartum period. This information is not intended to replace advice given to you by your health care provider. Make sure you discuss any questions you have with your health care provider. Document Revised: 02/22/2020 Document Reviewed: 02/22/2020 Elsevier Patient Education  2021 [employer].

## 2020-10-03 ENCOUNTER — Other Ambulatory Visit (HOSPITAL_COMMUNITY): Payer: Self-pay

## 2020-10-03 LAB — SURGICAL PATHOLOGY

## 2020-10-03 NOTE — Lactation Note (Addendum)
This note was copied from a baby's chart. Lactation Consultation Note  Patient Name: Mary Duran [id number] Date: 10/03/2020   Age:35 hours  Mom reports her milk is in. R breast is full, L breast is less full. Infant fed relatively recently and is not interested in feeding at this time. Mom is interested in pumping soon b/c her breasts are feeling tight. I encouraged Mom to pump for comfort, not until she is empty. Mom already has 17 mL of EBM in the refrigerator. Infant has only lost 70 g over the last 24 hrs (and had 7 stools with 2 voids during that time period).    Mom says infant is nursing well, but reports that sometimes her nipples have a "lipstick" shape when infant releases latch. Nipples are atraumatic. Specifics of an asymmetric latch were shown via [employer]. Mom knows she can call for me to return to observe latch.   Lurline Hare [facility] 10/03/2020, 8:43 AM

## 2020-10-03 NOTE — Lactation Note (Signed)
This note was copied from a baby's chart. Lactation Consultation Note  Patient Name: Mary Duran [id number] Date: 10/03/2020 Reason for consult: Follow-up assessment;Early term 37-38.6wks Age:35 hours  I stopped by to see Mom prior to d/c. Mom stated she knew how to use the hand pump that was included in her DEBP kit. Mom also has a Medela PIS for home use. Feeding attempted after infant's diaper change, but infant was not interested.  Mom reports hearing gulps when infant is at breast & says that her breasts soften with feedings.  Mom is aware of our breastfeeding support groups & I also made her aware of [facility] support group.    Matthias Hughs [facility] 10/03/2020, 12:01 PM

## 2020-10-03 NOTE — Progress Notes (Signed)
POSTPARTUM PROGRESS NOTE  Subjective: Mary Duran is a 35 y.o. [id number] s/p SVD at [redacted]w[redacted]d.  She reports she doing well. No acute events overnight. She denies any problems with ambulating, voiding or po intake. Denies nausea or vomiting. She has passed flatus. Pain is well controlled.  Lochia is mild.  Objective: Blood pressure 119/71, pulse 68, temperature 98.3 F (36.8 C), temperature source Oral, resp. rate 18, height 5\' 3"  (1.6 m), weight 87.1 kg, last menstrual period 01/16/2020, SpO2 100 %, unknown if currently breastfeeding.  Physical Exam:  General: alert, cooperative and no distress Chest: no respiratory distress Abdomen: soft, non-tender  Uterine Fundus: firm and at level of umbilicus Extremities: No calf swelling or tenderness  no edema  Recent Labs    10/01/20 0033 10/01/20 1331  HGB 10.6* 11.7*  HCT 32.0* 35.4*    Assessment/Plan: Mary Duran is a 35 y.o. 31 s/p SVD at [redacted]w[redacted]d for gHTN.  Routine Postpartum Care: Doing well, pain well-controlled.  -- Continue routine care, lactation support  --gHTN: Counseled on starting procardia for persistent MR pressures, however patient declined at this time.  -- Contraception: IUD as OP  -- Feeding: breast     Dispo: Plan for discharge PPD#2 .  [redacted]w[redacted]d, MD OB Fellow, [facility] 10/03/2020 6:20 PM

## 2020-10-04 ENCOUNTER — Telehealth (HOSPITAL_COMMUNITY): Payer: Self-pay

## 2020-10-04 NOTE — Progress Notes (Signed)
Patient was assessed and managed by nursing staff during this encounter. I have reviewed the chart and agree with the documentation and plan. I have also made any necessary editorial changes.  Blackburn Bing, MD 10/04/2020 6:59 AM

## 2020-10-04 NOTE — Progress Notes (Signed)
Patient was assessed and managed by nursing staff during this encounter. I have reviewed the chart and agree with the documentation and plan. I have also made any necessary editorial changes.  Greydis Stlouis, MD 10/04/2020 6:59 AM   

## 2020-10-07 ENCOUNTER — Ambulatory Visit: Payer: No Typology Code available for payment source

## 2020-10-07 ENCOUNTER — Encounter: Admitting: Student

## 2020-10-07 ENCOUNTER — Encounter: Admitting: Obstetrics and Gynecology

## 2020-10-10 ENCOUNTER — Ambulatory Visit (INDEPENDENT_AMBULATORY_CARE_PROVIDER_SITE_OTHER): Payer: No Typology Code available for payment source | Admitting: General Practice

## 2020-10-10 ENCOUNTER — Other Ambulatory Visit: Payer: Self-pay

## 2020-10-10 VITALS — BP 131/93 | HR 67 | Wt 170.0 lb

## 2020-10-10 DIAGNOSIS — Z0130 Encounter for examination of blood pressure without abnormal findings: Secondary | ICD-10-CM | POA: Diagnosis not present

## 2020-10-10 DIAGNOSIS — R300 Dysuria: Secondary | ICD-10-CM

## 2020-10-10 DIAGNOSIS — O165 Unspecified maternal hypertension, complicating the puerperium: Secondary | ICD-10-CM | POA: Diagnosis not present

## 2020-10-10 LAB — POCT URINALYSIS DIP (DEVICE)
Bilirubin Urine: NEGATIVE
Glucose, UA: NEGATIVE mg/dL
Ketones, ur: NEGATIVE mg/dL
Nitrite: NEGATIVE
Protein, ur: NEGATIVE mg/dL
Specific Gravity, Urine: 1.015 (ref 1.005–1.030)
Urobilinogen, UA: 0.2 mg/dL (ref 0.0–1.0)
pH: 6 (ref 5.0–8.0)

## 2020-10-10 MED ORDER — NIFEDIPINE ER 60 MG PO TB24: 60.0000 mg | ORAL_TABLET | Freq: Every day | ORAL | 0 refills | Status: DC

## 2020-10-10 NOTE — Progress Notes (Signed)
Patient presents to office today for blood pressure check following vaginal delivery on 4/12. Patient reports burning with urination for the past 2 days and generalized abdominal pain since yesterday. She reports headaches at night time but is uncertain if this is related to being sleep deprived. Denies dizziness or blurry vision. No edema noted. Patient reports at home blood pressures of 140s-150s/90s-100s. She is taking procardia 30mg  daily. UA shows moderate leuks. Discussed with Dr who advises patient increase procardia to 60mg  daily, return in 1 week for repeat BP check, send urine for culture & patient should increase hydration, start drinking cranberry juice, & continue sitz baths. Reviewed with patient. Patient verbalized understanding to updated plan of care & will await urine culture results.   Alvester Morin RN BSN 10/10/20

## 2020-10-11 LAB — URINE CULTURE

## 2020-10-15 ENCOUNTER — Encounter: Payer: No Typology Code available for payment source | Admitting: Advanced Practice Midwife

## 2020-10-17 ENCOUNTER — Telehealth (INDEPENDENT_AMBULATORY_CARE_PROVIDER_SITE_OTHER)

## 2020-10-17 ENCOUNTER — Encounter: Payer: Self-pay | Admitting: Medical

## 2020-10-17 VITALS — BP 120/98 | HR 84

## 2020-10-17 DIAGNOSIS — Z8759 Personal history of other complications of pregnancy, childbirth and the puerperium: Secondary | ICD-10-CM

## 2020-10-17 DIAGNOSIS — N369 Urethral disorder, unspecified: Secondary | ICD-10-CM | POA: Diagnosis not present

## 2020-10-17 DIAGNOSIS — Z0130 Encounter for examination of blood pressure without abnormal findings: Secondary | ICD-10-CM

## 2020-10-17 MED ORDER — NIFEDIPINE ER OSMOTIC RELEASE 90 MG PO TB24: 90.0000 mg | ORAL_TABLET | Freq: Every day | ORAL | 0 refills | Status: AC

## 2020-10-17 MED ORDER — HYDROCHLOROTHIAZIDE 12.5 MG PO CAPS: 12.5000 mg | ORAL_CAPSULE | Freq: Every day | ORAL | 0 refills | Status: AC

## 2020-10-17 NOTE — Progress Notes (Signed)
Chart reviewed for nurse visit. Agree with plan of care.   Marny Lowenstein, PA-C 10/17/2020 1:21 PM

## 2020-10-17 NOTE — Progress Notes (Signed)
I connected with  Chrishelle Zito Monty on 10/17/20 at 10:00 AM EDT by telephone and verified that I am speaking with the correct person using two identifiers.   I discussed the limitations, risks, security and privacy concerns of performing an evaluation and management service by telephone and the availability of in person appointments. I also discussed with the patient that there may be a patient responsible charge related to this service. The patient expressed understanding and agreed to proceed.  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  Reviewed with Dr Magnus Sinning. Advised pt to have 1 week recheck for BP. Also to change Rx to 90 mg of Nifedipine daily. Rx sent to pharmacy. Pt verbalized understanding. Pt can also have provider visit for possible skene's gland blockage with BP check in 1 week or be seen before if becomes unbearable.  Pt agreeable and verbalized understanding. Will have front office call to schedule appt.    Pt also c/o white bump near urethra that burns when urinating x 1 week.  Denies any hx of HSV or UTI sx.  Pt states had husband is MD and did exam and possible skene's gland blockage. Advised per Dr Magnus Sinning can try sitz bath and warm compress to area 2-3 times a day to help promote drainage. Pt agreeable.   Judeth Cornfield, RN  10/17/20

## 2020-10-22 ENCOUNTER — Inpatient Hospital Stay (HOSPITAL_COMMUNITY): Admit: 2020-10-22 | Payer: Self-pay

## 2020-10-22 NOTE — Progress Notes (Signed)
Attestation of Attending Supervision of clinical support staff: I agree with the care provided to this patient and was available for any consultation.  I have reviewed the RN's note and chart. I was available for consult and to see the patient if needed.   Reene Harlacher Niles Felita Bump, MD, MPH, ABFM Attending Physician [facility]  

## 2020-10-24 ENCOUNTER — Ambulatory Visit: Payer: No Typology Code available for payment source | Admitting: Obstetrics and Gynecology

## 2020-10-24 ENCOUNTER — Ambulatory Visit: Payer: No Typology Code available for payment source

## 2020-11-01 ENCOUNTER — Ambulatory Visit (INDEPENDENT_AMBULATORY_CARE_PROVIDER_SITE_OTHER): Payer: No Typology Code available for payment source | Admitting: Obstetrics & Gynecology

## 2020-11-01 ENCOUNTER — Other Ambulatory Visit: Payer: Self-pay

## 2020-11-01 ENCOUNTER — Encounter: Payer: Self-pay | Admitting: Obstetrics & Gynecology

## 2020-11-01 MED ORDER — NORETHINDRONE 0.35 MG PO TABS: 1.0000 | ORAL_TABLET | Freq: Every day | ORAL | 11 refills | Status: AC

## 2020-11-01 NOTE — Progress Notes (Signed)
    Post Partum Visit Note  Mary Duran is a 35 y.o. [phone] female who presents for a postpartum visit. She is 4 weeks postpartum following a normal spontaneous vaginal delivery.  I have fully reviewed the prenatal and intrapartum course. The delivery was at 37  gestational weeks.  Anesthesia: local. Postpartum course has been normal. Baby is doing well. Baby is feeding by breast. Bleeding no bleeding. Bowel function is normal. Bladder function is normal. Patient is not sexually active. Contraception method is oral progesterone-only contraceptive. Postpartum depression screening: negative.   The pregnancy intention screening data noted above was reviewed. Potential methods of contraception were discussed. The patient elected to proceed with Oral Contraceptive.     Health Maintenance Due  Topic Date Due  . COVID-19 Vaccine (3 - Booster for Pfizer series) 02/19/2020    The following portions of the patient's history were reviewed and updated as appropriate: allergies, current medications, past family history, past medical history, past social history, past surgical history and problem list.  Review of Systems Genitourinary:positive for vulvar irritation  Objective:  LMP 01/16/2020    General:  alert, cooperative and no distress           Abdomen: soft, non-tender; bowel sounds normal; no masses,  no organomegaly   Wound n/a  GU exam:  perineal lac well healing       Assessment:    There are no diagnoses linked to this encounter.  Doing well at postpartum exam.   Plan:   Essential components of care per ACOG recommendations:  1.  Mood and well being: Patient with negative depression screening today. Reviewed local resources for support.  - Patient tobacco use? No.   - hx of drug use? No.    2. Infant care and feeding:  -Patient currently breastmilk feeding? Yes. Reviewed importance of draining breast regularly to support lactation.  -Social determinants of health  (SDOH) reviewed in EPIC. No concerns The following needs were identified  3. Sexuality, contraception and birth spacing - Patient does not want a pregnancy in the next year.  Desired family size is 4 children.  - Reviewed forms of contraception in tiered fashion. Patient desired oral progesterone-only contraceptive today.   - Discussed birth spacing of 18 months  4. Sleep and fatigue -Encouraged family/partner/community support of 4 hrs of uninterrupted sleep to help with mood and fatigue  5. Physical Recovery  - Discussed patients delivery and complications. She describes her labor as good. - Patient had a Vaginal, no problems at delivery. Patient had a 1st degree laceration. Perineal healing reviewed. Patient expressed understanding - Patient has urinary incontinence? No. - Patient is safe to resume physical and sexual activity  6.  Health Maintenance - HM due items addressed Yes - Last pap smear No results found for: DIAGPAP Pap smear not done at today's visit. 08/2018 pap nl -Breast Cancer screening indicated? No.   7. Chronic Disease/Pregnancy Condition follow up: prediabetes  - PCP follow up  Isabell Jarvis, RN [facility] [employer], [facility]

## 2020-11-01 NOTE — Patient Instructions (Signed)
Hypertension During Pregnancy Hypertension is also called high blood pressure. High blood pressure means that the force of the blood moving in your body is high enough to cause problems for you and your baby. Different types of high blood pressure can happen during pregnancy. The types are:  High blood pressure before you got pregnant. This is called chronic hypertension.  This can continue during your pregnancy. Your doctor will want to keep checking your blood pressure. You may need medicine to control your blood pressure while you are pregnant. You will need follow-up visits after you have your baby.  High blood pressure that goes up during pregnancy when it was normal before. This is called gestational hypertension. It will often get better after you have your baby, but your doctor will need to watch your blood pressure to make sure that it is getting better.  You may develop high blood pressure after giving birth. This is called postpartum hypertension. This often occurs within 48 hours after childbirth but may occur up to 6 weeks after giving birth. Very high blood pressure during pregnancy is an emergency that needs treatment right away. How does this affect me? If you have high blood pressure during pregnancy, you have a higher chance of developing high blood pressure:  As you get older.  If you get pregnant again. In some cases, high blood pressure during pregnancy can cause:  Stroke.  Heart attack.  Damage to the kidneys, lungs, or liver.  Preeclampsia.  HELLP syndrome.  Seizures.  Problems with the placenta. How does this affect my baby? Your baby may:  Be born early.  Not weigh as much as he or she should.  Not handle labor well, leading to a C-section. This condition may also result in a baby's death before birth (stillbirth). What are the risks?  Having high blood pressure during a past pregnancy.  Being overweight.  Being age 35 or older.  Being pregnant  for the first time.  Being pregnant with more than one baby.  Becoming pregnant using fertility methods, such as IVF.  Having other problems, such as diabetes or kidney disease. What can I do to lower my risk?  Keep a healthy weight.  Eat a healthy diet.  Follow what your doctor tells you about treating any medical problems that you had before you got pregnant. It is very important to go to all of your doctor visits. Your doctor will check your blood pressure and make sure that your pregnancy is progressing as it should. Treatment should start early if a problem is found.   How is this treated? Treatment for high blood pressure during pregnancy can vary. It depends on the type of high blood pressure you have and how serious it is.  If you were taking medicine for your blood pressure before you got pregnant, talk with your doctor. You may need to change the medicine during pregnancy if it is not safe for your baby.  If your blood pressure goes up during pregnancy, your doctor may order medicine to treat this.  If you are at risk for preeclampsia, your doctor may tell you to take a low-dose aspirin while you are pregnant.  If you have very high blood pressure, you may need to stay in the hospital so you and your baby can be watched closely. You may also need to take medicine to lower your blood pressure.  In some cases, if your condition gets worse, you may need to have your baby early.   Follow these instructions at home: Eating and drinking  Drink enough fluid to keep your pee (urine) pale yellow.  Avoid caffeine.   Lifestyle  Do not smoke or use any products that contain nicotine or tobacco. If you need help quitting, ask your doctor.  Do not use alcohol or drugs.  Avoid stress.  Rest and get plenty of sleep.  Regular exercise can help. Ask your doctor what kinds of exercise are best for you. General instructions  Take over-the-counter and prescription medicines only as  told by your doctor.  Keep all prenatal and follow-up visits. Contact a doctor if:  You have symptoms that your doctor told you to watch for, such as: ? Headaches. ? A feeling like you may vomit (nausea). ? Vomiting. ? Belly (abdominal) pain. ? Feeling dizzy or light-headed. Get help right away if:  You have symptoms of serious problems, such as: ? Very bad belly pain that does not get better with treatment. ? A very bad headache that does not get better. ? Blurry vision. ? Double vision. ? Vomiting that does not get better. ? Sudden, fast weight gain. ? Sudden swelling in your hands, ankles, or face. ? Bleeding from your vagina. ? Blood in your pee. ? Shortness of breath. ? Chest pain. ? Weakness on one side of your body. ? Trouble talking.  Your baby is not moving as much as usual. These symptoms may be an emergency. Get help right away. Call your local emergency services (911 in the [place]  Do not wait to see if the symptoms will go away.  Do not drive yourself to the hospital. Summary  High blood pressure is also called hypertension.  High blood pressure means that the force of the blood moving in your body is high enough to cause problems for you and your baby.  Get help right away if you have symptoms of serious problems due to high blood pressure.  Keep all prenatal and follow-up visits. This information is not intended to replace advice given to you by your health care provider. Make sure you discuss any questions you have with your health care provider. Document Revised: 02/29/2020 Document Reviewed: 02/29/2020 Elsevier Patient Education  2021 [employer]  

## 2020-11-11 ENCOUNTER — Other Ambulatory Visit: Payer: Self-pay | Admitting: General Practice

## 2020-11-11 DIAGNOSIS — R7303 Prediabetes: Secondary | ICD-10-CM

## 2020-11-15 ENCOUNTER — Other Ambulatory Visit: Payer: No Typology Code available for payment source

## 2021-05-06 IMAGING — US US MFM OB FOLLOW-UP
1 series · 13 of 28 positions shown · non-contrast
Comparison: none

[Series 1: us mfm ob follow-up · 35 acquisitions, 13 frames shown]
[im 2/35]
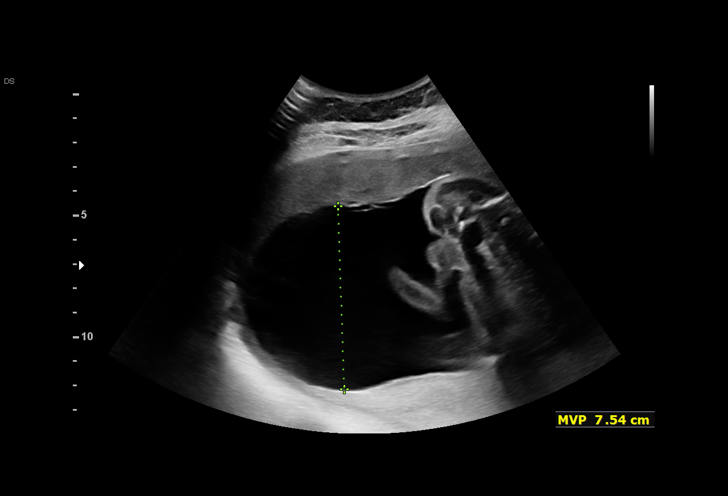
[im 4/35]
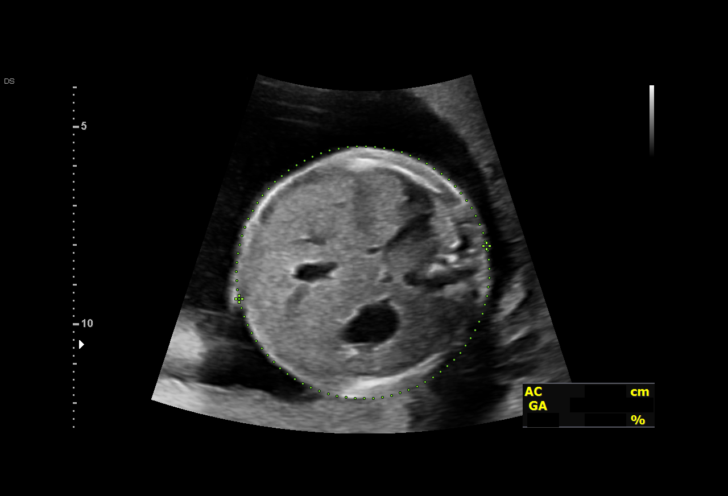
[im 7/35]
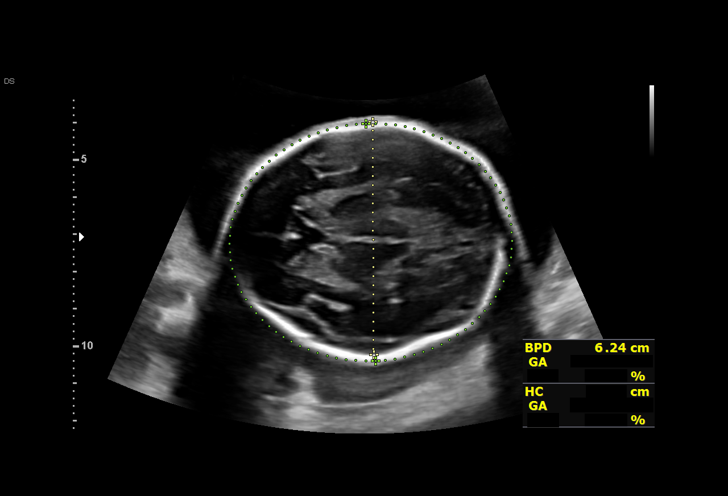
[im 9/35]
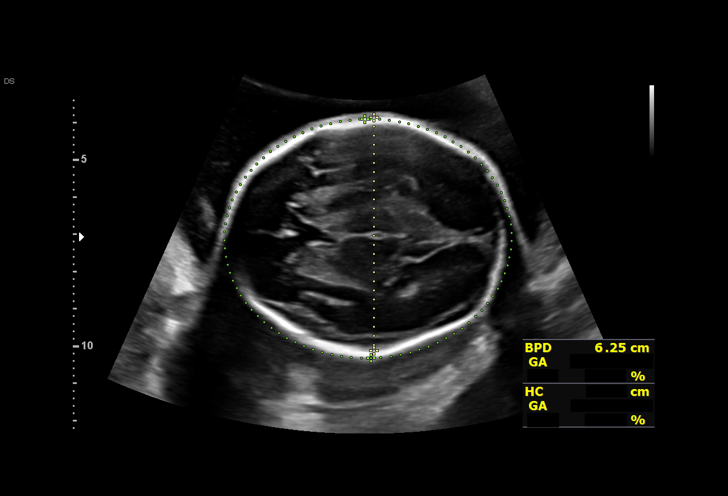
[im 12/35]
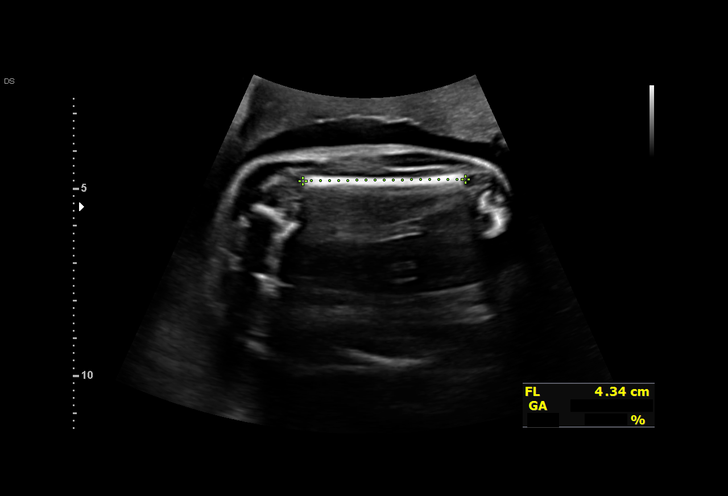
[im 14/35]
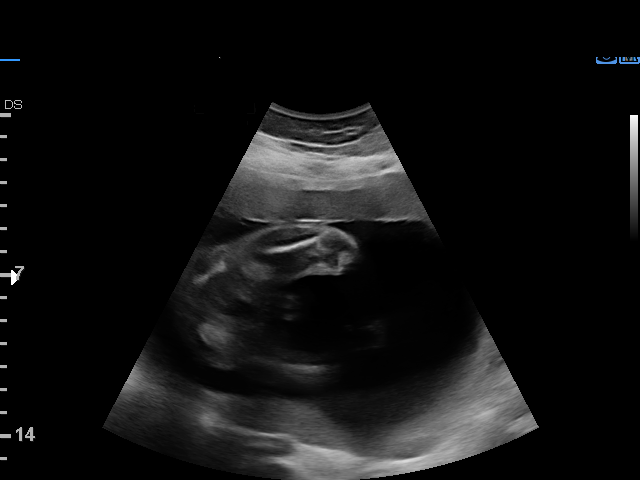
[im 18/35]
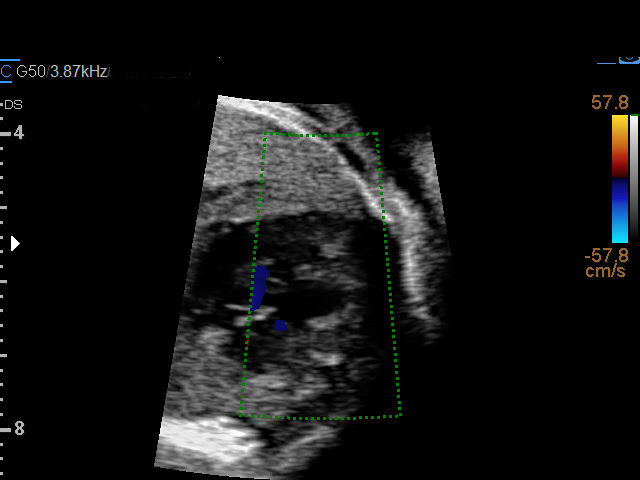
[im 21/35]
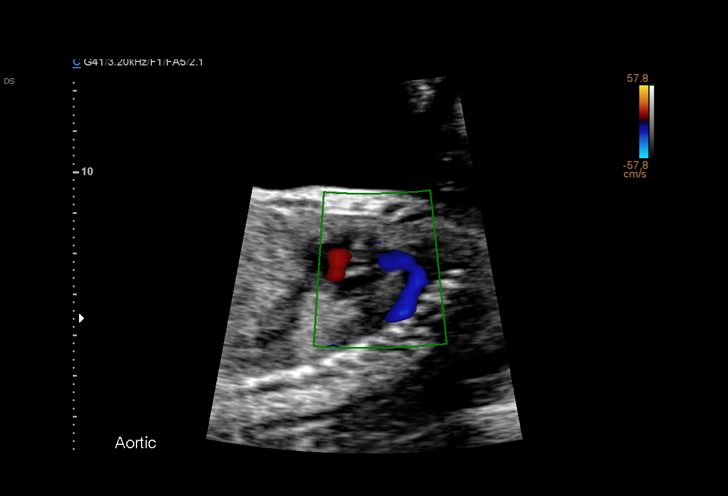
[im 23/35]
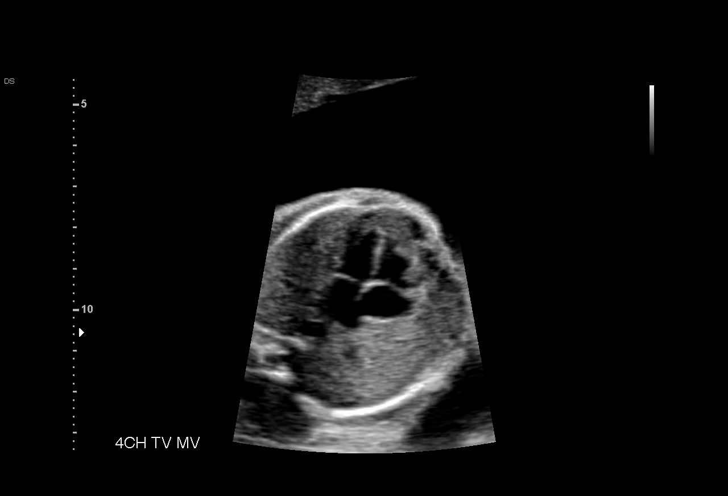
[im 26/35]
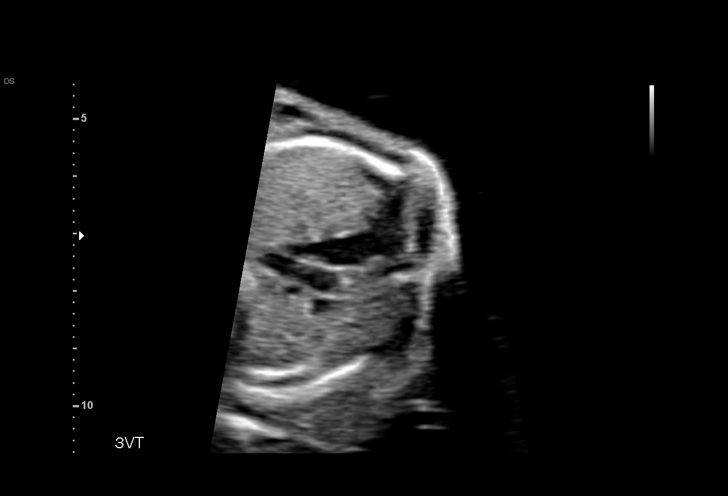
[im 28/35]
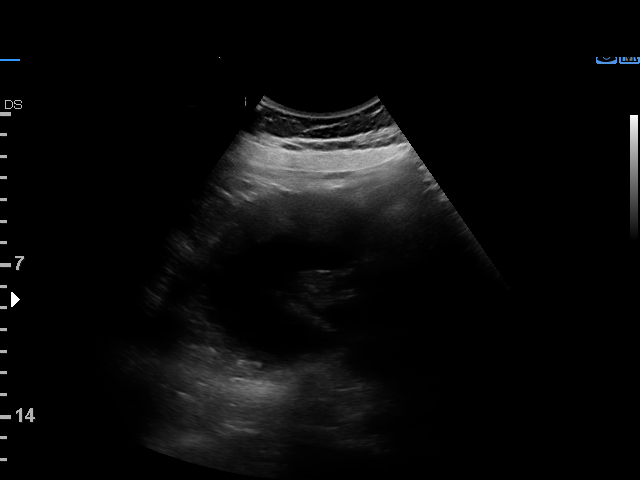
[im 31/35]
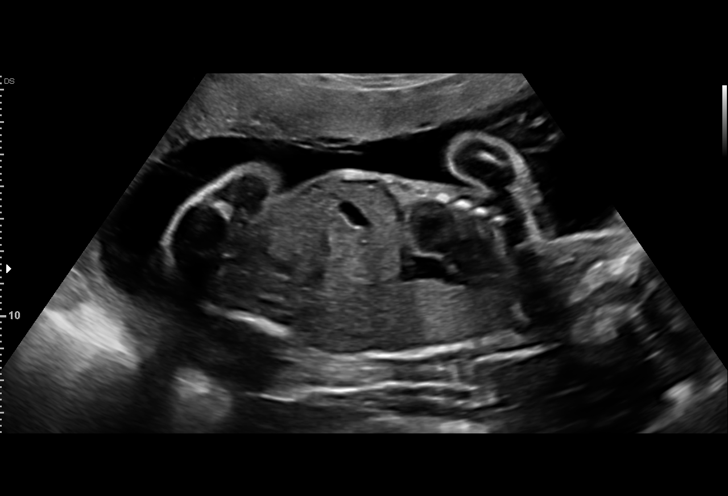
[im 33/35]
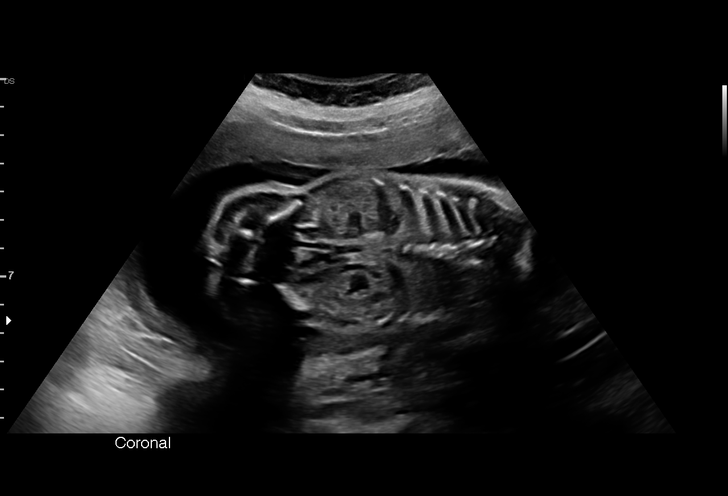

[13 of 28 positions shown; findings below may reference images not displayed]

Indications

 Advanced maternal age multigravida 35+,
 second trimester
 Polyhydramnios, second trimester,
 antepartum condition or complication, fetus 1
 Prior poor obstetrical history antepartum,
 second trimester (ectopic, anxiety)
 Encounter for other antenatal screening
 follow-up (declined testing)
 23 weeks gestation of pregnancy
Fetal Evaluation

 Num Of Fetuses:         1
 Cardiac Activity:       Observed
 Presentation:           Cephalic
 Placenta:               Anterior
 P. Cord Insertion:      Visualized, central

 Amniotic Fluid
 AFI FV:      Polyhydramnios

                             Largest Pocket(cm)

Biometry

 BPD:      62.5  mm     G. Age:  25w 2d         96  %    CI:         79.6   %    70 - 86
                                                         FL/HC:      19.3   %    19.2 -
 HC:      221.4  mm     G. Age:  24w 1d         64  %    HC/AC:      1.11        1.05 -
 AC:      199.8  mm     G. Age:  24w 4d         78  %    FL/BPD:     68.3   %    71 - 87
 FL:       42.7  mm     G. Age:  24w 0d         56  %    FL/AC:      21.4   %    20 - 24

 Est. FW:     689  gm      1 lb 8 oz     85  %
OB History

 Gravidity:    4
 Living:       2
Gestational Age

 LMP:           23w 3d        Date:  01/16/20                 EDD:   10/22/20
 U/S Today:     24w 4d                                        EDD:   10/14/20
 Best:          23w 3d     Det. By:  LMP  (01/16/20)          EDD:   10/22/20
Anatomy

 Cranium:               Appears normal         LVOT:                   Appears normal
 Cavum:                 Appears normal         Aortic Arch:            Appears normal
 Ventricles:            Appears normal         Ductal Arch:            Appears normal
 Choroid Plexus:        Appears normal         Diaphragm:              Appears normal
 Cerebellum:            Appears normal         Stomach:                Appears normal, left
                                                                       sided
 Posterior Fossa:       Appears normal         Abdomen:                Previously seen
 Nuchal Fold:           Not applicable (>20    Abdominal Wall:         Appears nml (cord
                        wks GA)                                        insert, abd wall)
 Face:                  Appears normal         Cord Vessels:           Previously seen
                        (orbits and profile)
 Lips:                  Appears normal         Kidneys:                Appear normal
 Palate:                Appears normal         Bladder:                Appears normal
 Thoracic:              Appears normal         Spine:                  Previously seen
 Heart:                 Appears normal         Upper Extremities:      Previously seen
                        (4CH, axis, and
                        situs)
 RVOT:                  Appears normal         Lower Extremities:      Previously seen

 Other:  Male gender previously seen. Nasal bone visualized. VC, 3VV and
         3VTV visualized.
Cervix Uterus Adnexa

 Cervix
 Length:           3.49  cm.
 Normal appearance by transabdominal scan.

 Right Ovary
 Within normal limits.

 Left Ovary
 Within normal limits.
Impression

 Follow up growth due to prior IUGR
 Normal interval growth with measurements consistent with
 dates
 Good fetal movement and amniotic fluid volume

 I discussed with Ms.Orla the diagnosis of
 polyhydramnios defined as the MVP > 8 or AFI >24 cm. We
 discussed the common etiologies include idiopathic,
 aneupolidy, gastrointestinal conditions, cranial facial defects,
 and neuromuscular conditions. There were no markers of
 aneuploidy or obvious causes of the previously mentioned
 conditions. She has a normal early 1 hr GTT. She declined
 genetic screening.When the AFI is > 30 or > 11.9  we
 recommend weekly testing however, given mild
 polyhydramnios is observed we recommend [facility]
 and delivery at 39 weeks.

 We have scheduled MS. Orla to return in 4 weeks. We
 recommend repeat 1GTT at 28 weeks.
Recommendations

 Follow up growth in 4 weeks.

## 2021-07-01 IMAGING — US US MFM OB FOLLOW-UP
1 series · 14 of 28 positions shown · non-contrast
Comparison: none

[Series 1: us mfm ob follow-up · 45 acquisitions, 14 frames shown]
[im 2/45]
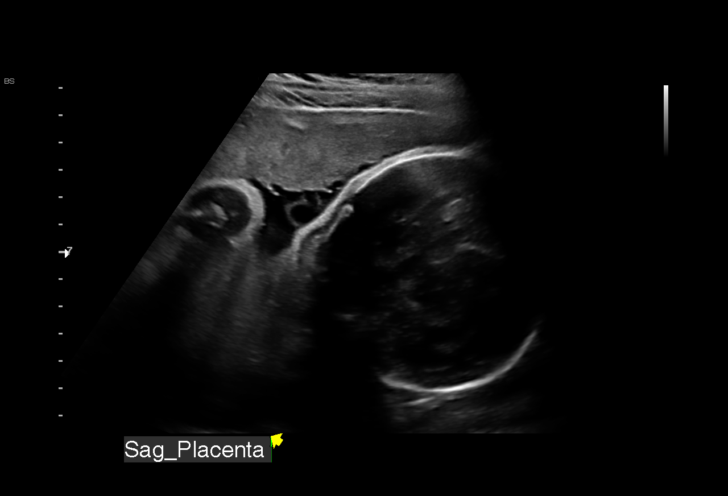
[im 5/45]
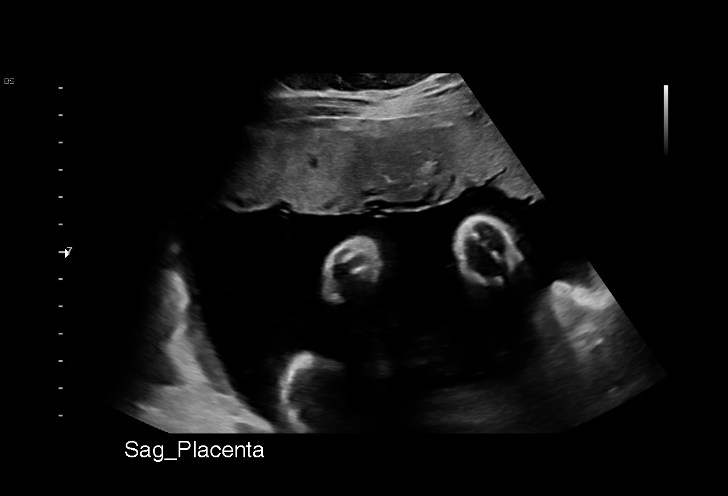
[im 9/45]
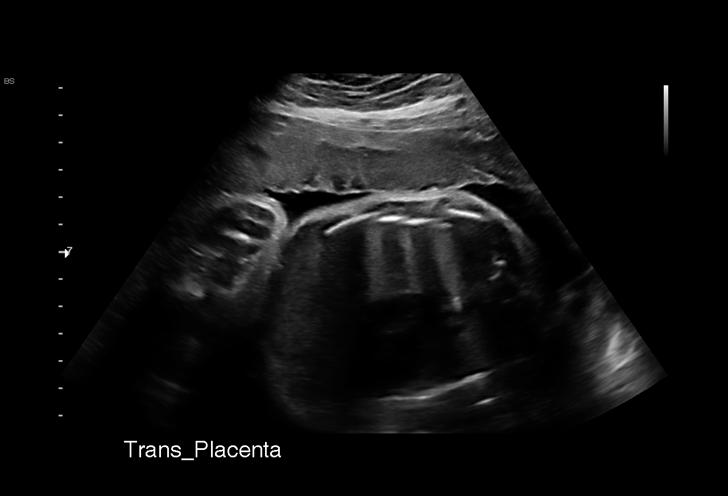
[im 12/45]
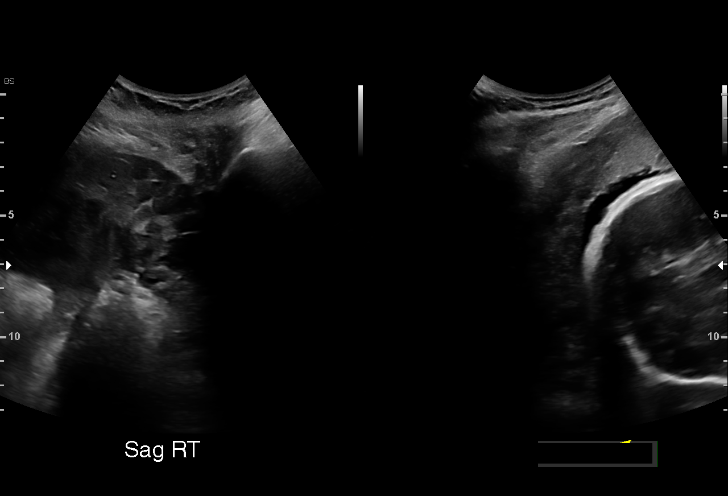
[im 15/45]
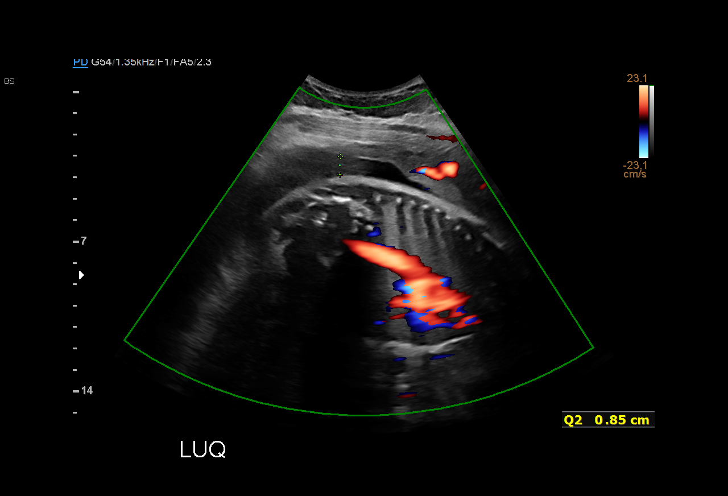
[im 18/45]
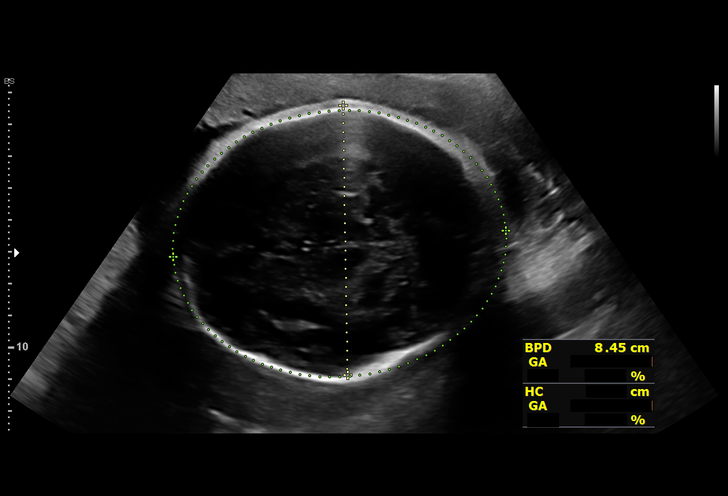
[im 22/45]
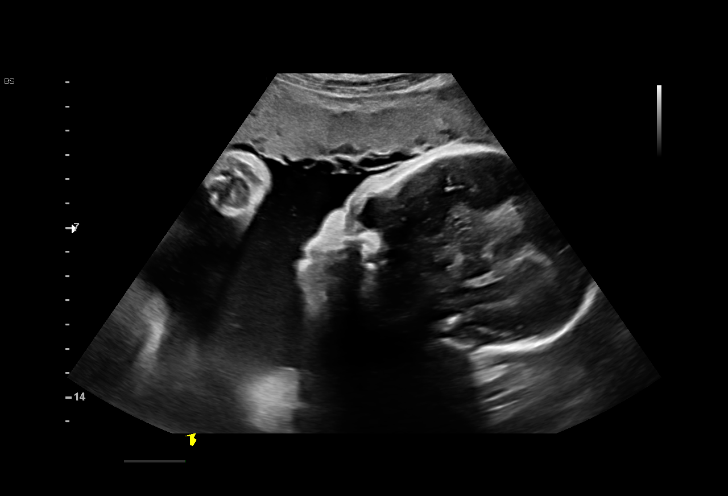
[im 25/45]
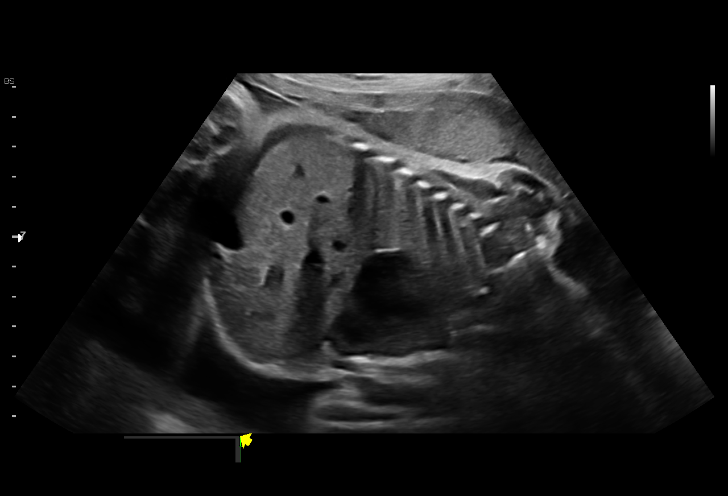
[im 28/45]
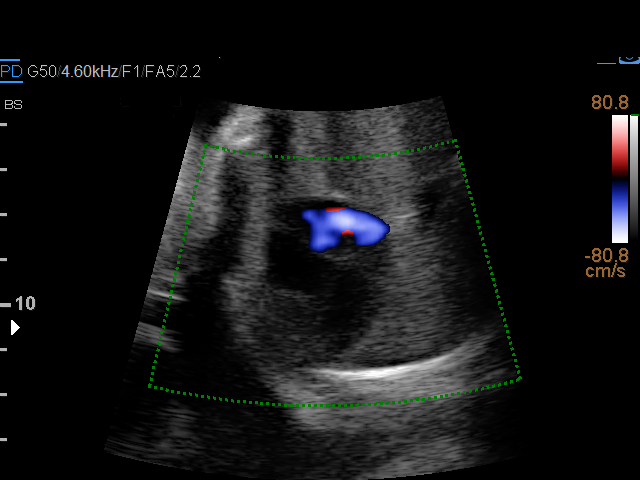
[im 31/45]
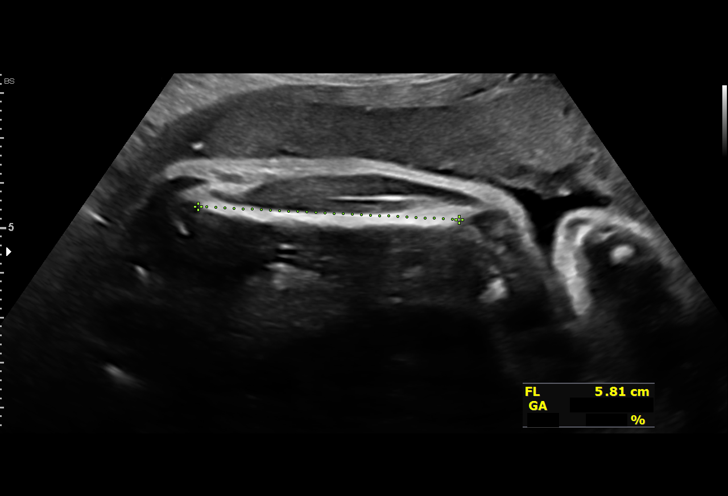
[im 35/45]
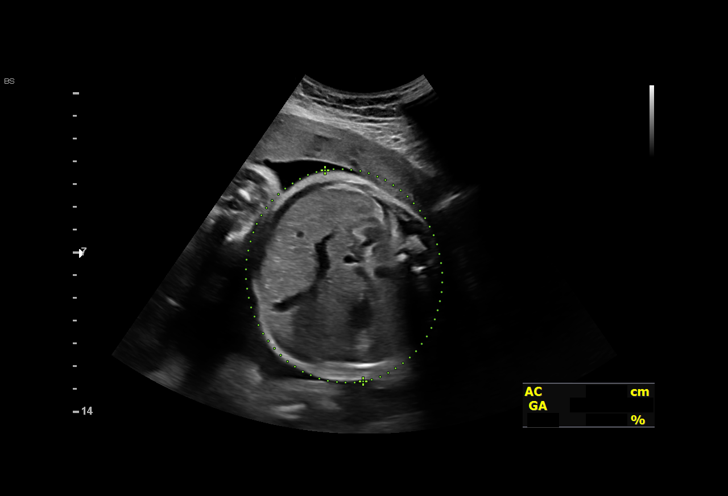
[im 38/45]
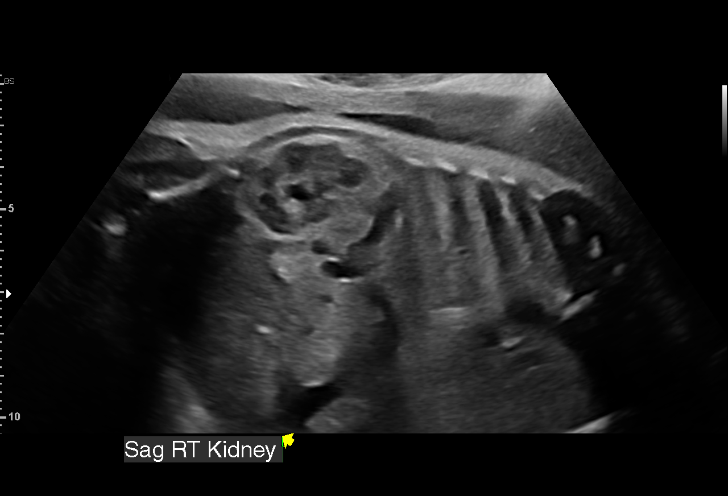
[im 41/45]
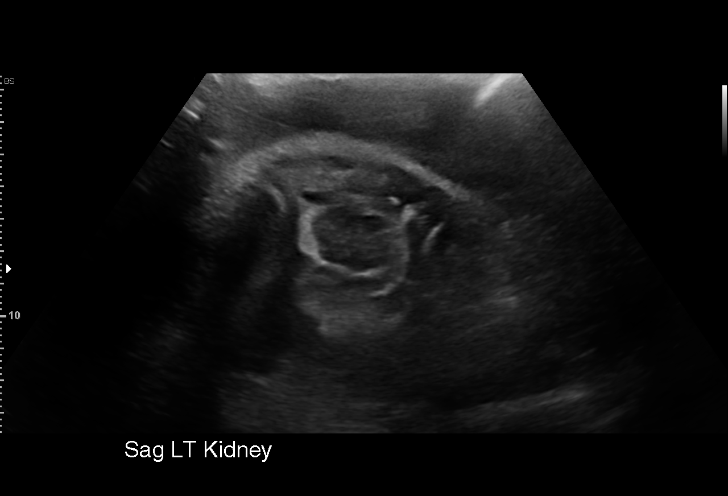
[im 45/45]
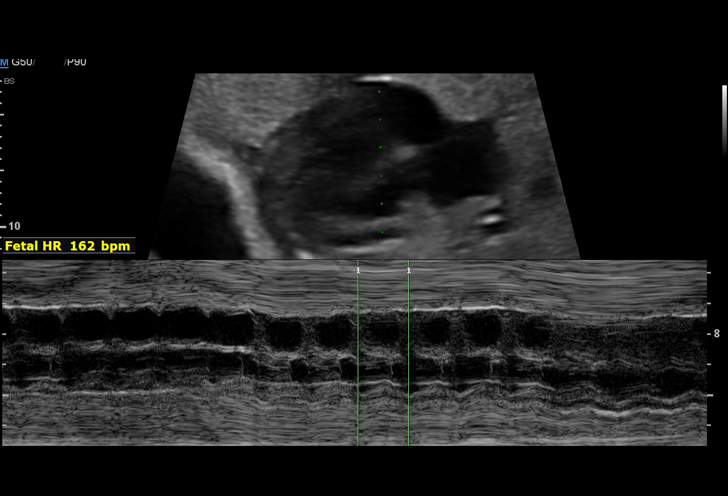

[14 of 28 positions shown; findings below may reference images not displayed]

Indications

 Advanced maternal age multigravida 35+,
 third trimester
 Medical complication of pregnancy (heart
 palpitations)
 Encounter for other antenatal screening
 follow-up (declined testing)
 Polyhydramnios, third trimester, antepartum
 condition or complication, unspecified fetus
 31 weeks gestation of pregnancy
 Prior poor obstetrical history antepartum,
 third trimester (ectopic, anxiety)
Fetal Evaluation

 Num Of Fetuses:         1
 Fetal Heart Rate(bpm):  162
 Cardiac Activity:       Observed
 Presentation:           Cephalic
 Placenta:               Anterior
 P. Cord Insertion:      Visualized, central

 Amniotic Fluid
 AFI FV:      Within normal limits

 AFI Sum(cm)     %Tile       Largest Pocket(cm)
 21.2            82

 RUQ(cm)       RLQ(cm)       LUQ(cm)        LLQ(cm)

Biometry

 BPD:      84.2  mm     G. Age:  33w 6d         95  %    CI:        79.39   %    70 - 86
                                                         FL/HC:      19.6   %    19.3 -
 HC:      298.7  mm     G. Age:  33w 1d         59  %    HC/AC:      1.05        0.96 -
 AC:      284.7  mm     G. Age:  32w 3d         78  %    FL/BPD:     69.4   %    71 - 87
 FL:       58.4  mm     G. Age:  30w 4d         15  %    FL/AC:      20.5   %    20 - 24
 LV:        1.9  mm

 Est. FW:    0655  gm      4 lb 3 oz     61  %
OB History

 Gravidity:    4
 Living:       2
Gestational Age

 LMP:           31w 3d        Date:  01/16/20                 EDD:   10/22/20
 U/S Today:     32w 4d                                        EDD:   10/14/20
 Best:          31w 3d     Det. By:  LMP  (01/16/20)          EDD:   10/22/20
Anatomy

 Cranium:               Previously seen        LVOT:                   Previously seen
 Cavum:                 Previously seen        Aortic Arch:            Previously seen
 Ventricles:            Appears normal         Ductal Arch:            Previously seen
 Choroid Plexus:        Previously seen        Diaphragm:              Appears normal
 Cerebellum:            Previously seen        Stomach:                Appears normal, left
                                                                       sided
 Posterior Fossa:       Previously seen        Abdomen:                Previously seen
 Nuchal Fold:           Not applicable (>20    Abdominal Wall:         Previously seen
                        wks GA)
 Face:                  Orbits and profile     Cord Vessels:           Previously seen
                        previously seen
 Lips:                  Previously seen        Kidneys:                Appear normal
 Palate:                Previously seen        Bladder:                Appears normal
 Thoracic:              Appears normal         Spine:                  Previously seen
 Heart:                 Previously seen        Upper Extremities:      Previously seen
 RVOT:                  Previously seen        Lower Extremities:      Previously seen

 Other:  Male gender previously seen. Nasal bone, VC, 3VV and 3VTV
         visualized previously.
Cervix Uterus Adnexa

 Cervix
 Not visualized (advanced GA >70wks)

 Uterus
 No abnormality visualized.

 Right Ovary
 Not visualized.

 Left Ovary
 Not visualized.
 Cul De Sac
 No free fluid seen.

 Adnexa
 No adnexal mass visualized.
Impression

 Follow up growth due to advance maternal age and
 polyhydramnios.
 Normal interval growth with measurements consistent with
 dates
 Good fetal movement and amniotic fluid volume
Recommendations

 Follow up growth as clinically indicated.
